# Patient Record
Sex: Female | Born: 1965 | Race: White | Hispanic: No | Marital: Married | Smoking: Never smoker
Health system: Southern US, Community
[De-identification: ages and names within clinical notes are randomized; demographics above are authoritative.]

## PROBLEM LIST (undated history)

## (undated) DIAGNOSIS — M1990 Unspecified osteoarthritis, unspecified site: Secondary | ICD-10-CM

## (undated) DIAGNOSIS — E039 Hypothyroidism, unspecified: Secondary | ICD-10-CM

---

## 1989-08-16 HISTORY — PX: THYROIDECTOMY, PARTIAL: SHX18

## 1995-08-17 HISTORY — PX: DILATION AND CURETTAGE OF UTERUS: SHX78

## 1996-08-16 HISTORY — PX: SHOULDER ARTHROSCOPY: SHX128

## 1998-09-01 ENCOUNTER — Ambulatory Visit (HOSPITAL_BASED_OUTPATIENT_CLINIC_OR_DEPARTMENT_OTHER): Admission: RE | Admit: 1998-09-01 | Discharge: 1998-09-01 | Payer: Self-pay | Admitting: Orthopedic Surgery

## 1998-12-31 ENCOUNTER — Other Ambulatory Visit: Admission: RE | Admit: 1998-12-31 | Discharge: 1998-12-31 | Payer: Self-pay | Admitting: Obstetrics and Gynecology

## 2000-02-04 ENCOUNTER — Other Ambulatory Visit: Admission: RE | Admit: 2000-02-04 | Discharge: 2000-02-04 | Payer: Self-pay | Admitting: Obstetrics and Gynecology

## 2001-05-31 ENCOUNTER — Other Ambulatory Visit: Admission: RE | Admit: 2001-05-31 | Discharge: 2001-05-31 | Payer: Self-pay | Admitting: Obstetrics and Gynecology

## 2002-01-26 ENCOUNTER — Other Ambulatory Visit: Admission: RE | Admit: 2002-01-26 | Discharge: 2002-01-26 | Payer: Self-pay | Admitting: Family Medicine

## 2004-05-11 ENCOUNTER — Other Ambulatory Visit: Admission: RE | Admit: 2004-05-11 | Discharge: 2004-05-11 | Payer: Self-pay | Admitting: Obstetrics and Gynecology

## 2005-09-27 ENCOUNTER — Emergency Department (HOSPITAL_COMMUNITY): Admission: EM | Admit: 2005-09-27 | Discharge: 2005-09-27 | Payer: Self-pay | Admitting: Emergency Medicine

## 2009-11-14 ENCOUNTER — Encounter: Admission: RE | Admit: 2009-11-14 | Discharge: 2009-11-14 | Payer: Self-pay | Admitting: Family Medicine

## 2009-12-09 ENCOUNTER — Other Ambulatory Visit: Admission: RE | Admit: 2009-12-09 | Discharge: 2009-12-09 | Payer: Self-pay | Admitting: Family Medicine

## 2009-12-30 ENCOUNTER — Encounter: Admission: RE | Admit: 2009-12-30 | Discharge: 2009-12-30 | Payer: Self-pay | Admitting: Internal Medicine

## 2009-12-30 ENCOUNTER — Other Ambulatory Visit: Admission: RE | Admit: 2009-12-30 | Discharge: 2009-12-30 | Payer: Self-pay | Admitting: Interventional Radiology

## 2011-03-15 ENCOUNTER — Encounter: Payer: Self-pay | Admitting: Podiatry

## 2012-12-14 ENCOUNTER — Encounter (HOSPITAL_BASED_OUTPATIENT_CLINIC_OR_DEPARTMENT_OTHER): Payer: Self-pay | Admitting: *Deleted

## 2012-12-14 NOTE — H&P (Signed)
Ashley Dudley is an 47 y.o. female.   Chief Complaint: Right knee pain HPI: Patient returns today reporting that the cortisone injection got rid of the majority of her right knee  pain, however, she still cannot straighten out her knee and has about a 10 flexion contracture now going on for 2 months.  Her medial right knee pain began when she was sledding on 09/29/2012.  Her foot got caught in the snow, twisting her knee back with a palpable pop.  Since then the pain has gotten better but she still cannot fully straighten her leg past 15 without significant discomfort.    Past Medical History  Diagnosis Date  . Hypothyroidism   . Arthritis     Past Surgical History  Procedure Laterality Date  . Shoulder arthroscopy  1998    right  . Thyroidectomy, partial  1991    left  . Dilation and curettage of uterus      History reviewed. No pertinent family history. Social History:  reports that she has never smoked. She does not have any smokeless tobacco history on file. She reports that  drinks alcohol. She reports that she does not use illicit drugs.  Allergies: No Known Allergies  No prescriptions prior to admission    No results found for this or any previous visit (from the past 48 hour(s)). No results found.  Review of Systems  Constitutional: Negative.   HENT: Negative.   Eyes: Negative.   Respiratory: Negative.   Cardiovascular: Negative.   Gastrointestinal: Negative.   Genitourinary: Negative.   Musculoskeletal: Positive for joint pain.  Skin: Negative.   Neurological: Negative.   Endo/Heme/Allergies: Negative.   Psychiatric/Behavioral: Negative.     Height 5\' 5"  (1.651 m), weight 68.04 kg (150 lb), last menstrual period 09/29/2012. Physical Exam  Constitutional: She is oriented to person, place, and time. She appears well-developed and well-nourished.  HENT:  Head: Normocephalic.  Eyes: Pupils are equal, round, and reactive to light.  Cardiovascular: Intact distal  pulses.   Respiratory: Effort normal.  Musculoskeletal:       Right knee: She exhibits decreased range of motion. Tenderness found. Medial joint line tenderness noted.  Neurological: She is alert and oriented to person, place, and time.  Psychiatric: She has a normal mood and affect.     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  10 flexion contracture secondary to probable incarcerated meniscus.  Plan: Options were discussed at length with the patient.  We'll get her set up for arthroscopic evaluation treatment of her right knee wrist benefits of surgery been discussed at length.  I will see her back at the outpatient surgery center.  Ashley Dudley M. 12/14/2012, 2:17 PM

## 2012-12-14 NOTE — Progress Notes (Signed)
No heart or resp problems 

## 2012-12-15 ENCOUNTER — Other Ambulatory Visit: Payer: Self-pay | Admitting: Orthopedic Surgery

## 2012-12-18 ENCOUNTER — Ambulatory Visit (HOSPITAL_BASED_OUTPATIENT_CLINIC_OR_DEPARTMENT_OTHER)
Admission: RE | Admit: 2012-12-18 | Discharge: 2012-12-18 | Disposition: A | Discharge status: Home / Self Care | Payer: BC Managed Care – PPO | Source: Ambulatory Visit | Attending: Orthopedic Surgery | Admitting: Orthopedic Surgery

## 2012-12-18 ENCOUNTER — Encounter (HOSPITAL_BASED_OUTPATIENT_CLINIC_OR_DEPARTMENT_OTHER)
Admission: RE | Disposition: A | Discharge status: Home / Self Care | Payer: Self-pay | Source: Ambulatory Visit | Attending: Orthopedic Surgery

## 2012-12-18 ENCOUNTER — Encounter (HOSPITAL_BASED_OUTPATIENT_CLINIC_OR_DEPARTMENT_OTHER): Payer: Self-pay | Admitting: Anesthesiology

## 2012-12-18 ENCOUNTER — Ambulatory Visit (HOSPITAL_BASED_OUTPATIENT_CLINIC_OR_DEPARTMENT_OTHER): Payer: BC Managed Care – PPO | Admitting: Anesthesiology

## 2012-12-18 DIAGNOSIS — S83509A Sprain of unspecified cruciate ligament of unspecified knee, initial encounter: Secondary | ICD-10-CM | POA: Insufficient documentation

## 2012-12-18 DIAGNOSIS — M129 Arthropathy, unspecified: Secondary | ICD-10-CM | POA: Insufficient documentation

## 2012-12-18 DIAGNOSIS — IMO0002 Reserved for concepts with insufficient information to code with codable children: Secondary | ICD-10-CM | POA: Insufficient documentation

## 2012-12-18 DIAGNOSIS — M25561 Pain in right knee: Secondary | ICD-10-CM

## 2012-12-18 DIAGNOSIS — X500XXA Overexertion from strenuous movement or load, initial encounter: Secondary | ICD-10-CM | POA: Insufficient documentation

## 2012-12-18 DIAGNOSIS — E039 Hypothyroidism, unspecified: Secondary | ICD-10-CM | POA: Insufficient documentation

## 2012-12-18 HISTORY — PX: KNEE ARTHROSCOPY WITH MEDIAL MENISECTOMY: SHX5651

## 2012-12-18 HISTORY — DX: Hypothyroidism, unspecified: E03.9

## 2012-12-18 HISTORY — DX: Unspecified osteoarthritis, unspecified site: M19.90

## 2012-12-18 SURGERY — ARTHROSCOPY, KNEE, WITH MEDIAL MENISCECTOMY
Anesthesia: General | Site: Knee | Laterality: Right | Wound class: Clean

## 2012-12-18 MED ORDER — ACETAMINOPHEN 10 MG/ML IV SOLN
1000.0000 mg | Freq: Once | INTRAVENOUS | Status: AC
  Administered 2012-12-18: 1000 mg via INTRAVENOUS

## 2012-12-18 MED ORDER — PROPOFOL 10 MG/ML IV BOLUS
INTRAVENOUS | Status: DC | PRN
  Administered 2012-12-18: 200 mg via INTRAVENOUS

## 2012-12-18 MED ORDER — MEPERIDINE HCL 25 MG/ML IJ SOLN: 6.2500 mg | INTRAMUSCULAR | Status: DC | PRN

## 2012-12-18 MED ORDER — FENTANYL CITRATE 0.05 MG/ML IJ SOLN
25.0000 ug | INTRAMUSCULAR | Status: DC | PRN
  Administered 2012-12-18: 50 ug via INTRAVENOUS
  Administered 2012-12-18: 25 ug via INTRAVENOUS

## 2012-12-18 MED ORDER — MIDAZOLAM HCL 2 MG/2ML IJ SOLN: 0.5000 mg | Freq: Once | INTRAMUSCULAR | Status: DC | PRN

## 2012-12-18 MED ORDER — SODIUM CHLORIDE 0.9 % IR SOLN
Status: DC | PRN
  Administered 2012-12-18: 12:00:00

## 2012-12-18 MED ORDER — CEFAZOLIN SODIUM-DEXTROSE 2-3 GM-% IV SOLR
2.0000 g | INTRAVENOUS | Status: AC
  Administered 2012-12-18: 2 g via INTRAVENOUS

## 2012-12-18 MED ORDER — FENTANYL CITRATE 0.05 MG/ML IJ SOLN
INTRAMUSCULAR | Status: DC | PRN
  Administered 2012-12-18: 50 ug via INTRAVENOUS
  Administered 2012-12-18: 100 ug via INTRAVENOUS

## 2012-12-18 MED ORDER — DEXAMETHASONE SODIUM PHOSPHATE 4 MG/ML IJ SOLN
INTRAMUSCULAR | Status: DC | PRN
  Administered 2012-12-18: 10 mg via INTRAVENOUS

## 2012-12-18 MED ORDER — ONDANSETRON HCL 4 MG/2ML IJ SOLN
INTRAMUSCULAR | Status: DC | PRN
  Administered 2012-12-18: 4 mg via INTRAVENOUS

## 2012-12-18 MED ORDER — OXYCODONE HCL 5 MG PO TABS: 5.0000 mg | ORAL_TABLET | Freq: Once | ORAL | Status: DC | PRN

## 2012-12-18 MED ORDER — HYDROCODONE-ACETAMINOPHEN 5-325 MG PO TABS: 1.0000 | ORAL_TABLET | ORAL | Status: DC | PRN

## 2012-12-18 MED ORDER — BUPIVACAINE-EPINEPHRINE 0.5% -1:200000 IJ SOLN
INTRAMUSCULAR | Status: DC | PRN
  Administered 2012-12-18: 20 mL

## 2012-12-18 MED ORDER — FENTANYL CITRATE 0.05 MG/ML IJ SOLN: 50.0000 ug | INTRAMUSCULAR | Status: DC | PRN

## 2012-12-18 MED ORDER — OXYCODONE HCL 5 MG/5ML PO SOLN: 5.0000 mg | Freq: Once | ORAL | Status: DC | PRN

## 2012-12-18 MED ORDER — LACTATED RINGERS IV SOLN
INTRAVENOUS | Status: DC
Start: 2012-12-18 — End: 2012-12-18
  Administered 2012-12-18 (×2): via INTRAVENOUS

## 2012-12-18 MED ORDER — CHLORHEXIDINE GLUCONATE 4 % EX LIQD: 60.0000 mL | Freq: Once | CUTANEOUS | Status: DC

## 2012-12-18 MED ORDER — PROMETHAZINE HCL 25 MG/ML IJ SOLN: 6.2500 mg | INTRAMUSCULAR | Status: DC | PRN

## 2012-12-18 MED ORDER — DEXTROSE-NACL 5-0.45 % IV SOLN: INTRAVENOUS | Status: DC

## 2012-12-18 MED ORDER — MIDAZOLAM HCL 5 MG/5ML IJ SOLN
INTRAMUSCULAR | Status: DC | PRN
  Administered 2012-12-18: 2 mg via INTRAVENOUS

## 2012-12-18 MED ORDER — MIDAZOLAM HCL 2 MG/2ML IJ SOLN: 1.0000 mg | INTRAMUSCULAR | Status: DC | PRN

## 2012-12-18 MED ORDER — LIDOCAINE HCL (CARDIAC) 20 MG/ML IV SOLN
INTRAVENOUS | Status: DC | PRN
  Administered 2012-12-18: 20 mg via INTRAVENOUS

## 2012-12-18 SURGICAL SUPPLY — 40 items
BANDAGE ELASTIC 6 VELCRO ST LF (GAUZE/BANDAGES/DRESSINGS) ×2 IMPLANT
BLADE 4.2CUDA (BLADE) IMPLANT
BLADE CUTTER GATOR 3.5 (BLADE) ×1 IMPLANT
BLADE GREAT WHITE 4.2 (BLADE) IMPLANT
CANISTER OMNI JUG 16 LITER (MISCELLANEOUS) ×1 IMPLANT
CANISTER SUCTION 2500CC (MISCELLANEOUS) IMPLANT
CHLORAPREP W/TINT 26ML (MISCELLANEOUS) ×2 IMPLANT
CLOTH BEACON ORANGE TIMEOUT ST (SAFETY) ×2 IMPLANT
DRAPE ARTHROSCOPY W/POUCH 114 (DRAPES) ×2 IMPLANT
ELECT MENISCUS 165MM 90D (ELECTRODE) IMPLANT
ELECT REM PT RETURN 9FT ADLT (ELECTROSURGICAL)
ELECTRODE REM PT RTRN 9FT ADLT (ELECTROSURGICAL) IMPLANT
GAUZE XEROFORM 1X8 LF (GAUZE/BANDAGES/DRESSINGS) ×2 IMPLANT
GLOVE BIO SURGEON STRL SZ7 (GLOVE) ×2 IMPLANT
GLOVE BIO SURGEON STRL SZ7.5 (GLOVE) ×2 IMPLANT
GLOVE BIOGEL M 7.0 STRL (GLOVE) ×1 IMPLANT
GLOVE BIOGEL PI IND STRL 7.0 (GLOVE) ×1 IMPLANT
GLOVE BIOGEL PI IND STRL 7.5 (GLOVE) IMPLANT
GLOVE BIOGEL PI IND STRL 8 (GLOVE) ×1 IMPLANT
GLOVE BIOGEL PI INDICATOR 7.0 (GLOVE) ×1
GLOVE BIOGEL PI INDICATOR 7.5 (GLOVE) ×1
GLOVE BIOGEL PI INDICATOR 8 (GLOVE) ×1
GLOVE EXAM NITRILE MD LF STRL (GLOVE) ×1 IMPLANT
GOWN PREVENTION PLUS XLARGE (GOWN DISPOSABLE) ×4 IMPLANT
IV NS IRRIG 3000ML ARTHROMATIC (IV SOLUTION) ×1 IMPLANT
KNEE WRAP E Z 3 GEL PACK (MISCELLANEOUS) ×2 IMPLANT
NDL SAFETY ECLIPSE 18X1.5 (NEEDLE) ×1 IMPLANT
NEEDLE HYPO 18GX1.5 SHARP (NEEDLE) ×2
PACK ARTHROSCOPY DSU (CUSTOM PROCEDURE TRAY) ×2 IMPLANT
PACK BASIN DAY SURGERY FS (CUSTOM PROCEDURE TRAY) ×2 IMPLANT
PENCIL BUTTON HOLSTER BLD 10FT (ELECTRODE) IMPLANT
SET ARTHROSCOPY TUBING (MISCELLANEOUS) ×2
SET ARTHROSCOPY TUBING LN (MISCELLANEOUS) ×1 IMPLANT
SLEEVE SCD COMPRESS KNEE MED (MISCELLANEOUS) IMPLANT
SPONGE GAUZE 4X4 12PLY (GAUZE/BANDAGES/DRESSINGS) ×2 IMPLANT
SYR 3ML 18GX1 1/2 (SYRINGE) IMPLANT
SYR 5ML LL (SYRINGE) ×2 IMPLANT
TOWEL OR 17X24 6PK STRL BLUE (TOWEL DISPOSABLE) ×2 IMPLANT
WAND STAR VAC 90 (SURGICAL WAND) IMPLANT
WATER STERILE IRR 1000ML POUR (IV SOLUTION) ×2 IMPLANT

## 2012-12-18 NOTE — Anesthesia Postprocedure Evaluation (Signed)
  Anesthesia Post-op Note  Patient: Ashley Dudley  Procedure(s) Performed: Procedure(s): Right Knee Arthroscopy with Partial Medial Menisectomy (Posterior Horn) and Debridement Partial Anterior Cruciate Ligament Tear (Right)  Patient Location: PACU  Anesthesia Type:General  Level of Consciousness: awake, alert , oriented and patient cooperative  Airway and Oxygen Therapy: Patient Spontanous Breathing  Post-op Pain: none  Post-op Assessment: Post-op Vital signs reviewed, Patient's Cardiovascular Status Stable, Respiratory Function Stable, Patent Airway, No signs of Nausea or vomiting and Pain level controlled  Post-op Vital Signs: Reviewed and stable  Complications: No apparent anesthesia complications

## 2012-12-18 NOTE — Transfer of Care (Signed)
Immediate Anesthesia Transfer of Care Note  Patient: Ashley Dudley  Procedure(s) Performed: Procedure(s): Right Knee Arthroscopy with Partial Medial Menisectomy (Posterior Horn) and Debridement Partial Anterior Cruciate Ligament Tear (Right)  Patient Location: PACU  Anesthesia Type:General  Level of Consciousness: sedated  Airway & Oxygen Therapy: Patient Spontanous Breathing and Patient connected to face mask oxygen  Post-op Assessment: Report given to PACU RN and Post -op Vital signs reviewed and stable  Post vital signs: Reviewed and stable  Complications: No apparent anesthesia complications

## 2012-12-18 NOTE — Anesthesia Preprocedure Evaluation (Signed)
Anesthesia Evaluation  Patient identified by MRN, date of birth, ID band Patient awake    Reviewed: Allergy & Precautions, H&P , NPO status , Patient's Chart, lab work & pertinent test results  History of Anesthesia Complications Negative for: history of anesthetic complications  Airway Mallampati: I TM Distance: >3 FB Neck ROM: Full    Dental  (+) Dental Advisory Given, Teeth Intact and Missing   Pulmonary neg pulmonary ROS,  breath sounds clear to auscultation  Pulmonary exam normal       Cardiovascular negative cardio ROS  Rhythm:Regular Rate:Normal     Neuro/Psych negative neurological ROS  negative psych ROS   GI/Hepatic negative GI ROS, Neg liver ROS,   Endo/Other  Hypothyroidism   Renal/GU negative Renal ROS     Musculoskeletal   Abdominal   Peds  Hematology   Anesthesia Other Findings   Reproductive/Obstetrics                           Anesthesia Physical Anesthesia Plan  ASA: II  Anesthesia Plan: General   Post-op Pain Management:    Induction: Intravenous  Airway Management Planned: LMA  Additional Equipment:   Intra-op Plan:   Post-operative Plan:   Informed Consent: I have reviewed the patients History and Physical, chart, labs and discussed the procedure including the risks, benefits and alternatives for the proposed anesthesia with the patient or authorized representative who has indicated his/her understanding and acceptance.   Dental advisory given  Plan Discussed with: Surgeon and CRNA  Anesthesia Plan Comments: (Plan routine monitors, GA- LMA OK)        Anesthesia Quick Evaluation

## 2012-12-18 NOTE — Op Note (Signed)
Pre-Op Dx: Right knee medial meniscal tear  Postop Dx: Same with partial anterior cruciate ligament tear   Procedure: Right knee arthroscopic partial medial meniscectomy posterior horn, debridement of partial anterior cruciate ligament tear anterolateral  Surgeon: Feliberto Gottron. Turner Daniels M.D.  Assist: Shirl Harris PA-C  Anes: General LMA  EBL: Minimal  Fluids: 800 cc   Indications: Patient was injured sledding a few months ago and has had pretty much a locked knee ever since then. Tender along the posterior and posterior medial joint lines consistent with medial meniscal tear. Lacks 10 of full extension and walks with a limp.. Pt has failed conservative treatment with anti-inflammatory medicines, observation, and modified activites but did get good temporarily from an intra-articular cortisone injection regarding pain, but still had a 10 flexion contracture.. Pain has recurred and patient desires elective arthroscopic evaluation and treatment of knee. Risks and benefits of surgery have been discussed and questions answered.  Procedure: Patient identified by arm band and taken to the operating room at the day surgery Center. The appropriate anesthetic monitors were attached, and General LMA anesthesia was induced without difficulty. Lateral post was applied to the table and the lower extremity was prepped and draped in usual sterile fashion from the ankle to the midthigh. Time out procedure was performed. We began the operation by making standard inferior lateral and inferior medial peripatellar portals with a #11 blade allowing introduction of the arthroscope through the inferior lateral portal and the out flow to the inferior medial portal. Pump pressure was set at 100 mmHg and diagnostic arthroscopy  revealed a normal patellofemoral joint, normal articular cartilage the medial and lateral compartments. The posterior horn of the medial meniscus had appeared the care that was flipping in and out of the  joint and this was removed with a 3.5 mm Gator sucker shaver. The anterolateral portion of the anterior cruciate ligament was frayed and torn and this was debrided back to stable margin, 34 so the fibers remained and were intact. The gutters were cleared medially and laterally the rest of the menisci were thoroughly probed and found to be intact.. The knee was irrigated out normal saline solution. A dressing of xerofoam 4 x 4 dressing sponges, web roll and an Ace wrap was applied. The patient was awakened extubated and taken to the recovery without difficulty.    Signed: Nestor Lewandowsky, MD

## 2012-12-18 NOTE — Interval H&P Note (Signed)
History and Physical Interval Note:  12/18/2012 11:44 AM  Ashley Dudley  has presented today for surgery, with the diagnosis of Right Knee Medial Meniscal Tear  The various methods of treatment have been discussed with the patient and family. After consideration of risks, benefits and other options for treatment, the patient has consented to  Procedure(s): RIGHT KNEE ARTHROSCOPY  (Right) as a surgical intervention .  The patient's history has been reviewed, patient examined, no change in status, stable for surgery.  I have reviewed the patient's chart and labs.  Questions were answered to the patient's satisfaction.     Nestor Lewandowsky

## 2012-12-18 NOTE — Anesthesia Procedure Notes (Signed)
Procedure Name: LMA Insertion Date/Time: 12/18/2012 12:05 PM Performed by: Burna Cash Pre-anesthesia Checklist: Patient identified, Emergency Drugs available, Suction available and Patient being monitored Patient Re-evaluated:Patient Re-evaluated prior to inductionOxygen Delivery Method: Circle System Utilized Preoxygenation: Pre-oxygenation with 100% oxygen Intubation Type: IV induction Ventilation: Mask ventilation without difficulty LMA: LMA inserted LMA Size: 4.0 Number of attempts: 1 Airway Equipment and Method: bite block Placement Confirmation: positive ETCO2 Tube secured with: Tape Dental Injury: Teeth and Oropharynx as per pre-operative assessment

## 2012-12-19 ENCOUNTER — Encounter (HOSPITAL_BASED_OUTPATIENT_CLINIC_OR_DEPARTMENT_OTHER): Payer: Self-pay | Admitting: Orthopedic Surgery

## 2013-04-26 ENCOUNTER — Other Ambulatory Visit: Payer: Self-pay | Admitting: Family Medicine

## 2013-04-26 ENCOUNTER — Other Ambulatory Visit (HOSPITAL_COMMUNITY)
Admission: RE | Admit: 2013-04-26 | Discharge: 2013-04-26 | Disposition: A | Discharge status: Home / Self Care | Payer: BC Managed Care – PPO | Source: Ambulatory Visit | Attending: Family Medicine | Admitting: Family Medicine

## 2013-04-26 DIAGNOSIS — E049 Nontoxic goiter, unspecified: Secondary | ICD-10-CM

## 2013-04-26 DIAGNOSIS — Z01419 Encounter for gynecological examination (general) (routine) without abnormal findings: Secondary | ICD-10-CM | POA: Insufficient documentation

## 2013-05-08 ENCOUNTER — Ambulatory Visit
Admission: RE | Admit: 2013-05-08 | Discharge: 2013-05-08 | Disposition: A | Discharge status: Home / Self Care | Payer: BC Managed Care – PPO | Source: Ambulatory Visit | Attending: Family Medicine | Admitting: Family Medicine

## 2013-05-08 DIAGNOSIS — E049 Nontoxic goiter, unspecified: Secondary | ICD-10-CM

## 2013-05-21 ENCOUNTER — Encounter (INDEPENDENT_AMBULATORY_CARE_PROVIDER_SITE_OTHER): Payer: Self-pay | Admitting: Surgery

## 2013-05-21 ENCOUNTER — Ambulatory Visit (INDEPENDENT_AMBULATORY_CARE_PROVIDER_SITE_OTHER): Payer: BC Managed Care – PPO | Admitting: Surgery

## 2013-05-21 VITALS — BP 110/68 | HR 64 | Temp 97.4°F | Resp 14 | Ht 65.0 in | Wt 157.2 lb

## 2013-05-21 DIAGNOSIS — E041 Nontoxic single thyroid nodule: Secondary | ICD-10-CM

## 2013-05-21 NOTE — Patient Instructions (Signed)
Thyroid Biopsy The thyroid gland is a butterfly-shaped gland situated in the front of the neck. It produces hormones which affect metabolism, growth and development, and body temperature. A thyroid biopsy is a procedure in which small samples of tissue or fluid are removed from the thyroid gland or mass and examined under a microscope. This test is done to determine the cause of thyroid problems, such as infection, cancer, or other thyroid problems. There are 2 ways to obtain samples: 1. Fine needle biopsy. Samples are removed using a thin needle inserted through the skin and into the thyroid gland or mass. 2. Open biopsy. Samples are removed after a cut (incision) is made through the skin. LET YOUR CAREGIVER KNOW ABOUT:   Allergies.  Medications taken including herbs, eye drops, over-the-counter medications, and creams.  Use of steroids (by mouth or creams).  Previous problems with anesthetics or numbing medicine.  Possibility of pregnancy, if this applies.  History of blood clots (thrombophlebitis).  History of bleeding or blood problems.  Previous surgery.  Other health problems. RISKS AND COMPLICATIONS  Bleeding from the site. The risk of bleeding is higher if you have a bleeding disorder or are taking any blood thinning medications (anticoagulants).  Infection.  Injury to structures near the thyroid gland. BEFORE THE PROCEDURE  This is a procedure that can be done as an outpatient. Confirm the time that you need to arrive for your procedure. Confirm whether there is a need to fast or withhold any medications. A blood sample may be done to determine your blood clotting time. Medicine may be given to help you relax (sedative). PROCEDURE Fine needle biopsy. You will be awake during the procedure. You may be asked to lie on your back with your head tipped backward to extend your neck. Let your caregiver know if you cannot tolerate the positioning. An area on your neck will be  cleansed. A needle is inserted through the skin of your neck. You may feel a mild discomfort during this procedure. You may be asked to avoid coughing, talking, swallowing, or making sounds during some portions of the procedure. The needle is withdrawn once tissue or fluid samples have been removed. Pressure may be applied to the neck to reduce swelling and ensure that bleeding has stopped. The samples will be sent for examination.  Open biopsy. You will be given general anesthesia. You will be asleep during the procedure. An incision is made in your neck. A sample of thyroid tissue or the mass is removed. The tissue sample or mass will be sent for examination. The sample or mass may be examined during the biopsy. If the sample or mass contains cancer cells, some or all of the thyroid gland may be removed. The incision is closed with stitches. AFTER THE PROCEDURE  Your recovery will be assessed and monitored. If there are no problems, as an outpatient, you should be able to go home shortly after the procedure. If you had a fine needle biopsy:  You may have soreness at the biopsy site for 1 to 2 days. If you had an open biopsy:   You may have soreness at the biopsy site for 3 to 4 days.  You may have a hoarse voice or sore throat for 1 to 2 days. Obtaining the Test Results It is your responsibility to obtain your test results. Do not assume everything is normal if you have not heard from your caregiver or the medical facility. It is important for you to follow up  on all of your test results. HOME CARE INSTRUCTIONS   Keeping your head raised on a pillow when you are lying down may ease biopsy site discomfort.  Supporting the back of your head and neck with both hands as you sit up from a lying position may ease biopsy site discomfort.  Only take over-the-counter or prescription medicines for pain, discomfort, or fever as directed by your caregiver.  Throat lozenges or gargling with warm salt  water may help to soothe a sore throat. SEEK IMMEDIATE MEDICAL CARE IF:   You have severe bleeding from the biopsy site.  You have difficulty swallowing.  You have a fever.  You have increased pain, swelling, redness, or warmth at the biopsy site.  You notice pus coming from the biopsy site.  You have swollen glands (lymph nodes) in your neck. Document Released: 05/30/2007 Document Revised: 10/25/2011 Document Reviewed: 10/30/2008 [facility] Patient Information 2014 [place], [place].

## 2013-05-21 NOTE — Progress Notes (Signed)
Patient ID: Ashley Dudley, female   DOB: Aug 29, 1965, 47 y.o.   MRN: [id number]  Chief Complaint  Patient presents with  . New Evaluation    eval thyroid cyst    HPI ARALYNN BRAKE is a 47 y.o. female.  Patient sent today at the request of Dr. Faylene Million for right thyroid nodule. Patient has history of left lower lobectomy 20 years ago for benign disease. She is developed some swelling in the right thyroid lobe. Underwent biopsy of the right thyroid gland 2011 which showed changes consistent with goiter. There is larger. She has minimal discomfort. Her husband has noted some difficulty swallowing. She otherwise states this is asymptomatic. She is on thyroid replacement for suppressive means. No hoarseness or voice changes. HPI  Past Medical History  Diagnosis Date  . Hypothyroidism   . Arthritis     Past Surgical History  Procedure Laterality Date  . Shoulder arthroscopy  1998    right  . Thyroidectomy, partial  1991    left  . Dilation and curettage of uterus    . Knee arthroscopy with medial menisectomy Right 12/18/2012    Procedure: Right Knee Arthroscopy with Partial Medial Menisectomy (Posterior Margaretmary Eddy) and Debridement Partial Anterior Cruciate Ligament Tear;  Surgeon: Nestor Lewandowsky, MD;  Location: [facility];  Service: Orthopedics;  Laterality: Right;    Family History  Problem Relation Age of Onset  . Cancer Brother     hodgekins lymphoma  . Cancer Maternal Grandmother     ovarian    Social History History  Substance Use Topics  . Smoking status: Never Smoker   . Smokeless tobacco: Never Used  . Alcohol Use: Yes     Comment: very rare    No Known Allergies  Current Outpatient Prescriptions  Medication Sig Dispense Refill  . Alfalfa 500 MG TABS Take by mouth.      . calcium acetate (PHOSLO) 667 MG capsule Take 667 mg by mouth 3 (three) times daily with meals.      Marland Kitchen glucosamine-chondroitin 500-400 MG tablet Take 1 tablet by mouth 3 (three) times daily.       . meloxicam (MOBIC) 15 MG tablet Take 15 mg by mouth daily.      . Multiple Vitamins-Minerals (MULTIVITAMIN WITH MINERALS) tablet Take 1 tablet by mouth daily.      Marland Kitchen thyroid (ARMOUR) 15 MG tablet Take 15 mg by mouth daily.      . vitamin C (ASCORBIC ACID) 250 MG tablet Take 250 mg by mouth daily.       No current facility-administered medications for this visit.    Review of Systems Review of Systems  Constitutional: Negative for fever, chills and unexpected weight change.  HENT: Negative for hearing loss, congestion, sore throat, trouble swallowing and voice change.   Eyes: Negative for visual disturbance.  Respiratory: Negative for cough and wheezing.   Cardiovascular: Negative for chest pain, palpitations and leg swelling.  Gastrointestinal: Negative for nausea, vomiting, abdominal pain, diarrhea, constipation, blood in stool, abdominal distention and anal bleeding.  Genitourinary: Negative for hematuria, vaginal bleeding and difficulty urinating.  Musculoskeletal: Negative for arthralgias.  Skin: Negative for rash and wound.  Neurological: Negative for seizures, syncope and headaches.  Hematological: Negative for adenopathy. Does not bruise/bleed easily.  Psychiatric/Behavioral: Negative for confusion.    Blood pressure 110/68, pulse 64, temperature 97.4 F (36.3 C), temperature source Temporal, resp. rate 14, height 5\' 5"  (1.651 m), weight 157 lb 3.2 oz (71.305 kg).  Physical Exam Physical Exam  Constitutional: She is oriented to person, place, and time. She appears well-developed and well-nourished.  HENT:  Head: Normocephalic and atraumatic.  Eyes: EOM are normal. Pupils are equal, round, and reactive to light.  Neck: No tracheal deviation present. Thyromegaly present.  Cardiovascular: Normal rate and regular rhythm.   Pulmonary/Chest: Effort normal and breath sounds normal. No stridor.  Musculoskeletal: Normal range of motion.  Lymphadenopathy:    She has no cervical  adenopathy.  Neurological: She is alert and oriented to person, place, and time.  Skin: Skin is warm and dry.  Psychiatric: She has a normal mood and affect. Her behavior is normal. Judgment and thought content normal.    Data Reviewed CLINICAL DATA: Goiter. Previous FNA biopsy of a dominant right mid  lesion and isthmic lesion 12/30/2009.  EXAM:  THYROID ULTRASOUND  TECHNIQUE:  Ultrasound examination of the thyroid gland and adjacent soft  tissues was performed.  COMPARISON: 12/30/2009 and earlier studies  FINDINGS:  Right thyroid lobe  Measurements: 51 x 20 x 33 mm. 19 x 28 x 33 mm complex cystic, mid  right.  9 mm solid, inferior right  5 mm solid, superior right  Left thyroid lobe  Measurements: Surgically absent. No significant residual/recurrent  tissue. No nodules visualized.  Isthmus  Thickness: 1.9 mm in thickness. 5 x 9 x 12 mm complex mostly solid,  right isthmus.  Lymphadenopathy  None visualized.  IMPRESSION:  1. Dominant 3.3 cm complex right cystic lesion. Correlate with  previous biopsy results.  2. Smaller isthmic and right lobe lesions as above.  3. Surgical removal of the left thyroid lobe with no  residual/recurrent tissue evident.  Electronically Signed  By: Oley Balm M.D.  On: 05/08/2013 12:16   Assessment    3 cm right thyroid complex cystic nodule    Plan    Recommend fine-needle aspiration. Further treatment after this is done. Discuss completion thyroidectomy the patient has been today. As another option. If benign, return in 6 months for followup. If suspicious will recommend completion thyroidectomy.       Arlander Gillen A. 05/21/2013, 11:51 AM

## 2013-05-22 ENCOUNTER — Other Ambulatory Visit (HOSPITAL_COMMUNITY)
Admission: RE | Admit: 2013-05-22 | Discharge: 2013-05-22 | Disposition: A | Discharge status: Home / Self Care | Payer: BC Managed Care – PPO | Source: Ambulatory Visit | Attending: Interventional Radiology | Admitting: Interventional Radiology

## 2013-05-22 ENCOUNTER — Ambulatory Visit
Admission: RE | Admit: 2013-05-22 | Discharge: 2013-05-22 | Disposition: A | Discharge status: Home / Self Care | Payer: BC Managed Care – PPO | Source: Ambulatory Visit | Attending: Surgery | Admitting: Surgery

## 2013-05-22 DIAGNOSIS — E041 Nontoxic single thyroid nodule: Secondary | ICD-10-CM | POA: Insufficient documentation

## 2013-05-25 ENCOUNTER — Telehealth (INDEPENDENT_AMBULATORY_CARE_PROVIDER_SITE_OTHER): Payer: Self-pay

## 2013-05-25 DIAGNOSIS — E041 Nontoxic single thyroid nodule: Secondary | ICD-10-CM

## 2013-05-25 NOTE — Telephone Encounter (Signed)
Message copied by Brennan Bailey on Fri May 25, 2013  4:35 PM ------      Message from: Harriette Bouillon A      Created: Thu May 24, 2013  7:05 AM       Needs to be repeated.  Not enough tissue to make diagnosis. Please reschedule with radiology ------

## 2013-05-25 NOTE — Telephone Encounter (Signed)
Called pt and gave her below msg. New order for thyroid biopsy put in epic.

## 2013-06-01 ENCOUNTER — Encounter (INDEPENDENT_AMBULATORY_CARE_PROVIDER_SITE_OTHER): Payer: Self-pay | Admitting: Surgery

## 2013-06-01 ENCOUNTER — Ambulatory Visit (INDEPENDENT_AMBULATORY_CARE_PROVIDER_SITE_OTHER): Payer: BC Managed Care – PPO | Admitting: Surgery

## 2013-06-01 ENCOUNTER — Telehealth (INDEPENDENT_AMBULATORY_CARE_PROVIDER_SITE_OTHER): Payer: Self-pay | Admitting: *Deleted

## 2013-06-01 VITALS — BP 120/80 | HR 68 | Temp 99.0°F | Resp 14 | Ht 65.0 in | Wt 158.0 lb

## 2013-06-01 DIAGNOSIS — E041 Nontoxic single thyroid nodule: Secondary | ICD-10-CM

## 2013-06-01 DIAGNOSIS — Z8639 Personal history of other endocrine, nutritional and metabolic disease: Secondary | ICD-10-CM

## 2013-06-01 DIAGNOSIS — Z862 Personal history of diseases of the blood and blood-forming organs and certain disorders involving the immune mechanism: Secondary | ICD-10-CM

## 2013-06-01 NOTE — Progress Notes (Signed)
Subjective:     Patient ID: Ashley Dudley, female   DOB: [birth year], 47 y.o.   MRN: [id number]  HPI  Patient returns after fine needle aspiration of right thyroid nodule. This was nondiagnostic. She is here to discuss next step. Review of Systems  Constitutional: Negative.   HENT: Negative.   Respiratory: Negative.   Cardiovascular: Negative.   Endocrine: Negative.        Objective:   Physical Exam  Constitutional: She appears well-developed and well-nourished.  HENT:  Head: Normocephalic and atraumatic.  Psychiatric: She has a normal mood and affect. Her behavior is normal. Judgment and thought content normal.       Assessment:     right thyroid nodule of undetermined significance status post nondiagnostic fine needle aspiration with history of hypothyroidism and previous left thyroid lobectomy for benign disease    Plan:     Discussed repeating her fine needle aspiration versus continued observation since the nodules been biopsied in the past versus completion thyroidectomy. Also scenarios  discussed with the patient and husband today.the risks and benefits of each discussed. She is leaning toward completion thyroidectomy. Will refer to ENT for laryngoscopy prior to any surgical intervention to evaluate cord function. She will call back to schedule or to follow depending on what they decide. Risks, benefits and alternatives of thyroidectomy discussed with the patient and husband today. Risk of bleeding, infection, damage to nerves in the neck causing voice and breathing problems, low calcium, injury to neighboring structures of the trachea, esophagus and major blood vessels discussed.

## 2013-06-01 NOTE — Patient Instructions (Signed)
Call to set up surgery or follow up.

## 2013-06-01 NOTE — Telephone Encounter (Signed)
LMOM for pt to return my call.  I was calling to inform her of an appt with Dr. Pollyann Kennedy (ENT) on 06/05/13 @ 1:10.  She needs to arrive at 12:55 and be sure to bring insurance cards, ID, as well as copay.  [facility] is located at [address].  Their phone number is [phone].

## 2013-06-21 ENCOUNTER — Other Ambulatory Visit: Payer: Self-pay

## 2013-06-21 ENCOUNTER — Telehealth (INDEPENDENT_AMBULATORY_CARE_PROVIDER_SITE_OTHER): Payer: Self-pay | Admitting: Surgery

## 2013-06-21 NOTE — Telephone Encounter (Addendum)
Called ENT office requesting their office notes for Dr Luisa Hart to review.

## 2013-06-21 NOTE — Telephone Encounter (Signed)
PT states seen ENT when is her surgery?

## 2013-06-22 ENCOUNTER — Telehealth (INDEPENDENT_AMBULATORY_CARE_PROVIDER_SITE_OTHER): Payer: Self-pay | Admitting: Surgery

## 2013-06-22 NOTE — Telephone Encounter (Signed)
Pt states: Why is my surgery not scheduled yet?  i called the ENT they stated they released me.  What do i have to do to get my surgery scheduled?  Explained to pt orders must be wrote  We cannot schedule surgery with out orders

## 2013-06-22 NOTE — Telephone Encounter (Signed)
Spoke to patient again and let her know I received the records late yesterday and I will have Dr Luisa Hart review them when he is back next week. She was wanting to make sure she could still get in for surgery before the end of the year. I let her know he still has openings on his calender for her to get scheduled by then.

## 2013-06-25 ENCOUNTER — Other Ambulatory Visit (INDEPENDENT_AMBULATORY_CARE_PROVIDER_SITE_OTHER): Payer: Self-pay | Admitting: Surgery

## 2013-07-02 ENCOUNTER — Encounter (HOSPITAL_COMMUNITY): Payer: Self-pay | Admitting: Pharmacy Technician

## 2013-07-05 ENCOUNTER — Encounter (HOSPITAL_COMMUNITY): Payer: Self-pay

## 2013-07-05 ENCOUNTER — Ambulatory Visit (HOSPITAL_COMMUNITY)
Admission: RE | Admit: 2013-07-05 | Discharge: 2013-07-05 | Disposition: A | Discharge status: Home / Self Care | Payer: BC Managed Care – PPO | Source: Ambulatory Visit | Attending: Surgery | Admitting: Surgery

## 2013-07-05 ENCOUNTER — Encounter (HOSPITAL_COMMUNITY)
Admission: RE | Admit: 2013-07-05 | Discharge: 2013-07-05 | Disposition: A | Discharge status: Home / Self Care | Payer: BC Managed Care – PPO | Source: Ambulatory Visit | Attending: Surgery | Admitting: Surgery

## 2013-07-05 DIAGNOSIS — Z01818 Encounter for other preprocedural examination: Secondary | ICD-10-CM | POA: Insufficient documentation

## 2013-07-05 LAB — BASIC METABOLIC PANEL
BUN: 12 mg/dL (ref 6–23)
CO2: 27 mEq/L (ref 19–32)
Calcium: 10 mg/dL (ref 8.4–10.5)
Chloride: 101 mEq/L (ref 96–112)
Creatinine, Ser: 0.76 mg/dL (ref 0.50–1.10)
GFR calc Af Amer: >90 mL/min (ref >90)
Glucose, Bld: 81 mg/dL (ref 70–99)

## 2013-07-05 LAB — CBC
HCT: 40.5 % (ref 36.0–46.0)
MCH: 29.6 pg (ref 26.0–34.0)
MCV: 90.2 fL (ref 78.0–100.0)
Platelets: 230 10*3/uL (ref 150–400)
RDW: 12.4 % (ref 11.5–15.5)
WBC: 6.6 10*3/uL (ref 4.0–10.5)

## 2013-07-05 NOTE — Pre-Procedure Instructions (Signed)
Ashley Dudley  07/05/2013   Your procedure is scheduled on:  07/17/13  Report to [facility]  2 * 3 at 730 AM.  Call this number if you have problems the morning of surgery: [phone]   Remember:   Do not eat food or drink liquids after midnight.   Take these medicines the morning of surgery with A SIP OF WATER: thyroid   Do not wear jewelry, make-up or nail polish.  Do not wear lotions, powders, or perfumes. You may wear deodorant.  Do not shave 48 hours prior to surgery. Men may shave face and neck.  Do not bring valuables to the hospital.  [facility] is not responsible                  for any belongings or valuables.               Contacts, dentures or bridgework may not be worn into surgery.  Leave suitcase in the car. After surgery it may be brought to your room.  For patients admitted to the hospital, discharge time is determined by your                treatment team.               Patients discharged the day of surgery will not be allowed to drive  home.  Name and phone number of your driver: family  Special Instructions: Shower using CHG 2 nights before surgery and the night before surgery.  If you shower the day of surgery use CHG.  Use special wash - you have one bottle of CHG for all showers.  You should use approximately 1/3 of the bottle for each shower.   Please read over the following fact sheets that you were given: Pain Booklet, Coughing and Deep Breathing and Surgical Site Infection Prevention

## 2013-07-16 MED ORDER — CEFAZOLIN SODIUM-DEXTROSE 2-3 GM-% IV SOLR
2.0000 g | INTRAVENOUS | Status: AC
  Administered 2013-07-17: 2 g via INTRAVENOUS
  Filled 2013-07-16: qty 50

## 2013-07-17 ENCOUNTER — Encounter (HOSPITAL_COMMUNITY): Payer: Self-pay | Admitting: *Deleted

## 2013-07-17 ENCOUNTER — Encounter (HOSPITAL_COMMUNITY): Payer: BC Managed Care – PPO | Admitting: Anesthesiology

## 2013-07-17 ENCOUNTER — Observation Stay (HOSPITAL_COMMUNITY)
Admission: RE | Admit: 2013-07-17 | Discharge: 2013-07-19 | Disposition: A | Discharge status: Home / Self Care | Payer: BC Managed Care – PPO | Source: Ambulatory Visit | Attending: Surgery | Admitting: Surgery

## 2013-07-17 ENCOUNTER — Ambulatory Visit (HOSPITAL_COMMUNITY): Payer: BC Managed Care – PPO | Admitting: Anesthesiology

## 2013-07-17 ENCOUNTER — Encounter (HOSPITAL_COMMUNITY)
Admission: RE | Disposition: A | Discharge status: Home / Self Care | Payer: Self-pay | Source: Ambulatory Visit | Attending: Surgery

## 2013-07-17 DIAGNOSIS — E039 Hypothyroidism, unspecified: Secondary | ICD-10-CM | POA: Insufficient documentation

## 2013-07-17 DIAGNOSIS — D34 Benign neoplasm of thyroid gland: Secondary | ICD-10-CM

## 2013-07-17 DIAGNOSIS — E041 Nontoxic single thyroid nodule: Principal | ICD-10-CM | POA: Diagnosis present

## 2013-07-17 DIAGNOSIS — E063 Autoimmune thyroiditis: Secondary | ICD-10-CM

## 2013-07-17 HISTORY — PX: THYROIDECTOMY: SHX17

## 2013-07-17 HISTORY — PX: TOTAL THYROIDECTOMY: SHX2547

## 2013-07-17 SURGERY — THYROIDECTOMY
Anesthesia: General | Site: Neck

## 2013-07-17 MED ORDER — OXYCODONE HCL 5 MG PO TABS: 5.0000 mg | ORAL_TABLET | Freq: Once | ORAL | Status: DC | PRN

## 2013-07-17 MED ORDER — LIDOCAINE HCL (CARDIAC) 20 MG/ML IV SOLN
INTRAVENOUS | Status: DC | PRN
  Administered 2013-07-17: 100 mg via INTRAVENOUS

## 2013-07-17 MED ORDER — 0.9 % SODIUM CHLORIDE (POUR BTL) OPTIME
TOPICAL | Status: DC | PRN
  Administered 2013-07-17: 1000 mL

## 2013-07-17 MED ORDER — ONDANSETRON HCL 4 MG PO TABS: 4.0000 mg | ORAL_TABLET | Freq: Four times a day (QID) | ORAL | Status: DC | PRN

## 2013-07-17 MED ORDER — ROCURONIUM BROMIDE 100 MG/10ML IV SOLN
INTRAVENOUS | Status: DC | PRN
  Administered 2013-07-17: 40 mg via INTRAVENOUS
  Administered 2013-07-17: 10 mg via INTRAVENOUS

## 2013-07-17 MED ORDER — PROPOFOL 10 MG/ML IV BOLUS
INTRAVENOUS | Status: DC | PRN
  Administered 2013-07-17: 120 mg via INTRAVENOUS
  Administered 2013-07-17: 30 mg via INTRAVENOUS
  Administered 2013-07-17: 20 mg via INTRAVENOUS

## 2013-07-17 MED ORDER — LACTATED RINGERS IV SOLN
INTRAVENOUS | Status: DC | PRN
  Administered 2013-07-17 (×2): via INTRAVENOUS

## 2013-07-17 MED ORDER — MORPHINE SULFATE 2 MG/ML IJ SOLN: 2.0000 mg | INTRAMUSCULAR | Status: DC | PRN

## 2013-07-17 MED ORDER — MIDAZOLAM HCL 5 MG/5ML IJ SOLN
INTRAMUSCULAR | Status: DC | PRN
  Administered 2013-07-17 (×2): 1 mg via INTRAVENOUS

## 2013-07-17 MED ORDER — HYDROMORPHONE HCL PF 1 MG/ML IJ SOLN
INTRAMUSCULAR | Status: AC
  Filled 2013-07-17: qty 1

## 2013-07-17 MED ORDER — DEXTROSE IN LACTATED RINGERS 5 % IV SOLN
INTRAVENOUS | Status: DC
  Administered 2013-07-17 – 2013-07-18 (×2): via INTRAVENOUS

## 2013-07-17 MED ORDER — THYROID 30 MG PO TABS
30.0000 mg | ORAL_TABLET | Freq: Every day | ORAL | Status: DC
  Administered 2013-07-18 – 2013-07-19 (×2): 30 mg via ORAL
  Filled 2013-07-17 (×4): qty 1

## 2013-07-17 MED ORDER — ONDANSETRON HCL 4 MG/2ML IJ SOLN: 4.0000 mg | Freq: Once | INTRAMUSCULAR | Status: DC | PRN

## 2013-07-17 MED ORDER — OXYCODONE-ACETAMINOPHEN 5-325 MG PO TABS
1.0000 | ORAL_TABLET | ORAL | Status: DC | PRN
Start: 2013-07-17 — End: 2013-07-19

## 2013-07-17 MED ORDER — GLYCOPYRROLATE 0.2 MG/ML IJ SOLN
INTRAMUSCULAR | Status: DC | PRN
  Administered 2013-07-17: .7 mg via INTRAVENOUS

## 2013-07-17 MED ORDER — CHLORHEXIDINE GLUCONATE 4 % EX LIQD: 1.0000 "application " | Freq: Once | CUTANEOUS | Status: DC

## 2013-07-17 MED ORDER — ARTIFICIAL TEARS OP OINT
TOPICAL_OINTMENT | OPHTHALMIC | Status: DC | PRN
  Administered 2013-07-17: 1 via OPHTHALMIC

## 2013-07-17 MED ORDER — OXYCODONE HCL 5 MG/5ML PO SOLN: 5.0000 mg | Freq: Once | ORAL | Status: DC | PRN

## 2013-07-17 MED ORDER — NEOSTIGMINE METHYLSULFATE 1 MG/ML IJ SOLN
INTRAMUSCULAR | Status: DC | PRN
  Administered 2013-07-17: 4 mg via INTRAVENOUS

## 2013-07-17 MED ORDER — LACTATED RINGERS IV SOLN
INTRAVENOUS | Status: DC
  Administered 2013-07-17: 08:00:00 via INTRAVENOUS

## 2013-07-17 MED ORDER — ENOXAPARIN SODIUM 40 MG/0.4ML ~~LOC~~ SOLN
40.0000 mg | SUBCUTANEOUS | Status: DC
  Administered 2013-07-18 – 2013-07-19 (×2): 40 mg via SUBCUTANEOUS
  Filled 2013-07-17 (×3): qty 0.4

## 2013-07-17 MED ORDER — HEMOSTATIC AGENTS (NO CHARGE) OPTIME
TOPICAL | Status: DC | PRN
  Administered 2013-07-17: 1 via TOPICAL

## 2013-07-17 MED ORDER — ONDANSETRON HCL 4 MG/2ML IJ SOLN
INTRAMUSCULAR | Status: DC | PRN
  Administered 2013-07-17: 4 mg via INTRAVENOUS

## 2013-07-17 MED ORDER — ONDANSETRON HCL 4 MG/2ML IJ SOLN
4.0000 mg | Freq: Four times a day (QID) | INTRAMUSCULAR | Status: DC | PRN
  Administered 2013-07-17 – 2013-07-18 (×3): 4 mg via INTRAVENOUS
  Filled 2013-07-17 (×3): qty 2

## 2013-07-17 MED ORDER — HYDROMORPHONE HCL PF 1 MG/ML IJ SOLN
0.2500 mg | INTRAMUSCULAR | Status: DC | PRN
  Administered 2013-07-17: 0.5 mg via INTRAVENOUS

## 2013-07-17 MED ORDER — FENTANYL CITRATE 0.05 MG/ML IJ SOLN
INTRAMUSCULAR | Status: DC | PRN
  Administered 2013-07-17 (×3): 50 ug via INTRAVENOUS
  Administered 2013-07-17: 100 ug via INTRAVENOUS

## 2013-07-17 MED ORDER — MEPERIDINE HCL 25 MG/ML IJ SOLN: 6.2500 mg | INTRAMUSCULAR | Status: DC | PRN

## 2013-07-17 MED ORDER — LABETALOL HCL 5 MG/ML IV SOLN
INTRAVENOUS | Status: DC | PRN
  Administered 2013-07-17 (×2): 5 mg via INTRAVENOUS

## 2013-07-17 SURGICAL SUPPLY — 47 items
ADH SKN CLS APL DERMABOND .7 (GAUZE/BANDAGES/DRESSINGS) ×1
BLADE SURG 10 STRL SS (BLADE) ×2 IMPLANT
BLADE SURG 15 STRL LF DISP TIS (BLADE) ×1 IMPLANT
BLADE SURG 15 STRL SS (BLADE) ×2
BLADE SURG ROTATE 9660 (MISCELLANEOUS) IMPLANT
CANISTER SUCTION 2500CC (MISCELLANEOUS) ×2 IMPLANT
CHLORAPREP W/TINT 10.5 ML (MISCELLANEOUS) ×2 IMPLANT
CLIP TI MEDIUM 24 (CLIP) ×2 IMPLANT
CLIP TI WIDE RED SMALL 24 (CLIP) ×2 IMPLANT
CONT SPEC 4OZ CLIKSEAL STRL BL (MISCELLANEOUS) IMPLANT
COVER SURGICAL LIGHT HANDLE (MISCELLANEOUS) ×2 IMPLANT
CRADLE DONUT ADULT HEAD (MISCELLANEOUS) ×2 IMPLANT
DERMABOND ADVANCED (GAUZE/BANDAGES/DRESSINGS) ×1
DERMABOND ADVANCED .7 DNX12 (GAUZE/BANDAGES/DRESSINGS) ×1 IMPLANT
DRAPE PED LAPAROTOMY (DRAPES) ×2 IMPLANT
DRAPE UTILITY 15X26 W/TAPE STR (DRAPE) ×4 IMPLANT
DRSG TEGADERM 2-3/8X2-3/4 SM (GAUZE/BANDAGES/DRESSINGS) ×1 IMPLANT
ELECT CAUTERY BLADE 6.4 (BLADE) ×2 IMPLANT
ELECT REM PT RETURN 9FT ADLT (ELECTROSURGICAL) ×2
ELECTRODE REM PT RTRN 9FT ADLT (ELECTROSURGICAL) ×1 IMPLANT
GAUZE SPONGE 4X4 16PLY XRAY LF (GAUZE/BANDAGES/DRESSINGS) ×2 IMPLANT
GLOVE BIO SURGEON STRL SZ8 (GLOVE) ×3 IMPLANT
GLOVE BIOGEL PI IND STRL 8 (GLOVE) ×1 IMPLANT
GLOVE BIOGEL PI INDICATOR 8 (GLOVE) ×2
GOWN STRL NON-REIN LRG LVL3 (GOWN DISPOSABLE) ×4 IMPLANT
GOWN STRL REIN XL XLG (GOWN DISPOSABLE) ×3 IMPLANT
HEMOSTAT SNOW SURGICEL 2X4 (HEMOSTASIS) ×2 IMPLANT
KIT BASIN OR (CUSTOM PROCEDURE TRAY) ×2 IMPLANT
KIT ROOM TURNOVER OR (KITS) ×2 IMPLANT
NS IRRIG 1000ML POUR BTL (IV SOLUTION) ×2 IMPLANT
PACK SURGICAL SETUP 50X90 (CUSTOM PROCEDURE TRAY) ×2 IMPLANT
PAD ARMBOARD 7.5X6 YLW CONV (MISCELLANEOUS) ×4 IMPLANT
PENCIL BUTTON HOLSTER BLD 10FT (ELECTRODE) ×2 IMPLANT
SHEARS HARMONIC 9CM CVD (BLADE) ×2 IMPLANT
SPECIMEN JAR MEDIUM (MISCELLANEOUS) IMPLANT
SPONGE INTESTINAL PEANUT (DISPOSABLE) ×2 IMPLANT
STAPLER VISISTAT 35W (STAPLE) ×1 IMPLANT
SUT MNCRL AB 4-0 PS2 18 (SUTURE) ×2 IMPLANT
SUT VIC AB 2-0 SH 18 (SUTURE) ×3 IMPLANT
SUT VIC AB 3-0 SH 18 (SUTURE) ×2 IMPLANT
SUT VICRYL AB 2 0 TIES (SUTURE) ×2 IMPLANT
SUT VICRYL AB 3 0 TIES (SUTURE) ×2 IMPLANT
SYR BULB 3OZ (MISCELLANEOUS) ×2 IMPLANT
TOWEL OR 17X24 6PK STRL BLUE (TOWEL DISPOSABLE) ×1 IMPLANT
TOWEL OR 17X26 10 PK STRL BLUE (TOWEL DISPOSABLE) ×2 IMPLANT
TUBE CONNECTING 12X1/4 (SUCTIONS) ×2 IMPLANT
WATER STERILE IRR 1000ML POUR (IV SOLUTION) IMPLANT

## 2013-07-17 NOTE — Interval H&P Note (Signed)
History and Physical Interval Note:  07/17/2013 8:46 AM  Ashley Dudley  has presented today for surgery, with the diagnosis of thyroid nodule  The various methods of treatment have been discussed with the patient and family. After consideration of risks, benefits and other options for treatment, the patient has consented to  Procedure(s): COMPLETION THYROIDECTOMY (N/A) as a surgical intervention .  The patient's history has been reviewed, patient examined, no change in status, stable for surgery.  I have reviewed the patient's chart and labs.  Questions were answered to the patient's satisfaction.     Kyriakos Babler A.

## 2013-07-17 NOTE — Anesthesia Preprocedure Evaluation (Addendum)
Anesthesia Evaluation  Patient identified by MRN, date of birth, ID band Patient awake    Reviewed: Allergy & Precautions, H&P , NPO status , Patient's Chart, lab work & pertinent test results  Airway Mallampati: I TM Distance: >3 FB Neck ROM: Full    Dental   Pulmonary          Cardiovascular     Neuro/Psych    GI/Hepatic   Endo/Other  Hypothyroidism   Renal/GU      Musculoskeletal   Abdominal   Peds  Hematology   Anesthesia Other Findings   Reproductive/Obstetrics                          Anesthesia Physical Anesthesia Plan  ASA: II  Anesthesia Plan: General   Post-op Pain Management:    Induction: Intravenous  Airway Management Planned: Oral ETT  Additional Equipment:   Intra-op Plan:   Post-operative Plan: Extubation in OR  Informed Consent: I have reviewed the patients History and Physical, chart, labs and discussed the procedure including the risks, benefits and alternatives for the proposed anesthesia with the patient or authorized representative who has indicated his/her understanding and acceptance.     Plan Discussed with: Surgeon and CRNA  Anesthesia Plan Comments:         Anesthesia Quick Evaluation

## 2013-07-17 NOTE — Transfer of Care (Signed)
Immediate Anesthesia Transfer of Care Note  Patient: Ashley Dudley  Procedure(s) Performed: Procedure(s): COMPLETION THYROIDECTOMY (N/A)  Patient Location: PACU  Anesthesia Type:General  Level of Consciousness: awake, alert , oriented and sedated  Airway & Oxygen Therapy: Patient Spontanous Breathing and Patient connected to nasal cannula oxygen  Post-op Assessment: Report given to PACU RN, Post -op Vital signs reviewed and stable and Patient moving all extremities  Post vital signs: Reviewed and stable  Complications: No apparent anesthesia complications

## 2013-07-17 NOTE — Op Note (Signed)
Thyroid Lobectomy Procedure Note Completion  Indications: This patient presents with a palpable nodule on the right side of the neck.  FNA non diagnostic.  Pt wished to proceed with completion thyroidectomy since hypothyroid and history of left thyroidectomy 20 years ago.  Risks of bleeding,  Infection,  Low calcium,  Nerve injury with hoarseness and airway compromise,  And need for other operations discussed.   The patient now presents for a thyroid lobectomy. The procedure has been discussed with the patient.  Alternative therapies have been discussed with the patient.  Operative risks include bleeding,  Infection,  Organ injury,  Nerve injury,  Blood vessel injury,  DVT,  Pulmonary embolism,  Death,  And possible reoperation.  Medical management risks include worsening of present situation.  The success of the procedure is 50 -90 % at treating patients symptoms.  The patient understands and agrees to proceed.  Pre-operative Diagnosis: right thyroid nodule  Post-operative Diagnosis:  same  Surgeon: Harriette Bouillon A.   Assistants: Dr Derrell Lolling  Anesthesia: General endotracheal anesthesia  ASA Class: 2  Procedure Details  The patient was seen in the Holding Room. The risks, benefits, complications, treatment options, and expected outcomes were discussed with the patient. The possibilities of reaction to medication, pulmonary aspiration, perforation of viscus, bleeding, recurrent infection, finding a normal thyroid, recurrently laryngeal nerve damage, the need for additional procedures, failure to diagnose a condition, and creating a complication requiring transfusion or operation were discussed with the patient. The patient concurred with the proposed plan, giving informed consent.  The site of surgery properly noted/marked. The patient was taken to Operating Room # 2, identified as Ashley Dudley and the procedure verified as  Completion Thyroidectomy. A Time Out was held and the above information  confirmed.  The patient was placed supine after induction of a general anesthetic.  The neck was extended and prepped and draped in standard fashion.  A 6 cm transverse cervical incision was created above the sternal notch within an old scar.  The strap muscles were identified and divided at the midline.  Sharp dissection was used to mobilize the right  thyroid lobe in a medial direction.  Dissection continued posteriorly to expose the tracheoesophageal groove and right carotid artery.  The recurrent laryngeal nerve was identified and preserved.  The thyroid lobe was mobilized further and the superior and inferior pole vessels were divided with the harmonic scalpel.  The middle thyroid vein was divided with the harmonic scalpel.  Small vessels were likewise divided.  The gland was rotated in a medial direction and taken off the trachea using the harmonic scalpel.  The isthmus was removed off the thyroid cartilage using the harmonic scalpel.  The specimen was submitted to pathology.  A suture was placed for orientation purposes. A 1 cm right paratracheal cystic mass identified and removed.   The wound was irrigated and inspected carefully.  The parathyroid tissue was found to be viable and the recurrent laryngeal nerve was left intact in its anatomic locations. Ashley Dudley was placed into surgical bed.  The strap muscles were closed with interrupted 3-0 Vicryl suture.  The platysma was closed with interrupted 3-0 Vicryl suture, and the skin incision was closed with a 4-0 Vicryl subcuticular closure. Dermabond was applied across the incision.  Instrument, sponge, and needle counts were correct prior to closure and at the conclusion of the case.   Findings: The right recurrent laryngeal nerve was identified.  Parathyroid tissue was identified and preserved with a viable blood  supply.  A nodule was found within the thyroid lobe, located in the upper pole.  This measured approximately 3 cm in diameter and mobile from  surrounding structures.   Estimated Blood Loss:  less than 50 mL         Drains: none         Total IV Fluids: 500 ml         Specimens: above          Complications:  None; patient tolerated the procedure well.         Disposition: PACU - hemodynamically stable.         Condition: stable  Attending Attestation: I performed the procedure.

## 2013-07-17 NOTE — Progress Notes (Signed)
Report given to diane walker rn as caregiver 

## 2013-07-17 NOTE — H&P (Signed)
Transplants  Currently admitted as of 07/17/2013  Specialty CommentsEditShow AllReport      05/21/2013:MAY RELEASE MEDICAL INFO TO MICHAELEEN DOWN 02/05/1960 DOS: 07/17/13 TC/[facility]-OPB-thyroid/gen 60240 KH  06/25/2013 patient scheduled for 23 hrs observation 07/17/2013 @ [facility] per [phone] Latoya D no precert required. (skm,chm)    None   Medications      Hospital Medications Outpatient Medications  Demographics  New medications from outside sources are available for reconciliation   ceFAZolin (ANCEF) IVPB 2 g/50 mL premix     chlorhexidine (HIBICLENS) 4 % liquid 1 application     lactated ringers infusion     Tilda Franco 47 year old female  Comm Pref:  [address]  [place] [address] [phone] (M) Works at OTHER [[employer]     Problem ListHospitalization Problem   Preferred Labs        None  None   Significant History/Details   &&&Transplant-Related Biopsies (11 years)      ** None **   Smoking: Never Smoker   &&&Patient Blood Type (50 years)    Smokeless Tobacco: Never Used  None   Alcohol: Yes    1 open order    Preferred Language: English    Dialysis History     None                     &&&Recent Visits (Maximum of 10 visits)   Date Type Provider Description  07/17/2013 Surgery Manisha Cancel A., MD   06/25/2013 Orders Only Harriette Bouillon A., MD   06/22/2013 Telephone Dortha Schwalbe., MD   06/21/2013 Telephone Dortha Schwalbe., MD   06/01/2013 Telephone Leonor Liv, RN   06/01/2013 Office Visit Mckynzi Cammon A., MD History of Thyroid Disease (Primary Dx); Thyroid Nodule  05/25/2013 Telephone Brennan Bailey, [place]   05/21/2013 Office Visit Dortha Schwalbe., MD Thyroid Nodule (Primary Dx)       My Last Outpatient Progress Note   Status Last Edited Encounter Date  Signed Fri Jun 01, 2013 12:30 PM EDT 06/01/2013  Subjective:    Patient ID: Ashley Dudley, female DOB: [birth year], 47 y.o. MRN: [id number]  HPI  Patient returns  after fine needle aspiration of right thyroid nodule. This was nondiagnostic. She is here to discuss next step.  Review of Systems  Constitutional: Negative.  HENT: Negative.  Respiratory: Negative.  Cardiovascular: Negative.  Endocrine: Negative.      Objective:    Physical Exam  Constitutional: She appears well-developed and well-nourished.  HENT:  Head: Normocephalic and atraumatic.  Psychiatric: She has a normal mood and affect. Her behavior is normal. Judgment and thought content normal.      Assessment:     right thyroid nodule of undetermined significance status post nondiagnostic fine needle aspiration with history of hypothyroidism and previous left thyroid lobectomy for benign disease     Plan:     Discussed repeating her fine needle aspiration versus continued observation since the nodules been biopsied in the past versus completion thyroidectomy. Also scenarios discussed with the patient and husband today.the risks and benefits of each discussed. She is leaning toward completion thyroidectomy. Will refer to ENT for laryngoscopy prior to any surgical intervention to evaluate cord function. She will call back to schedule or to follow depending on what they decide. Risks, benefits and alternatives of thyroidectomy discussed with the patient and husband today. Risk of bleeding, infection, damage to nerves in the neck causing voice and breathing problems, low calcium, injury to neighboring  structures of the trachea, esophagus and major blood vessels discussed.

## 2013-07-17 NOTE — Anesthesia Procedure Notes (Signed)
Procedure Name: Intubation Date/Time: 07/17/2013 10:15 AM Performed by: Harriette Bouillon A. Pre-anesthesia Checklist: Patient identified, Emergency Drugs available, Suction available, Patient being monitored and Timeout performed Patient Re-evaluated:Patient Re-evaluated prior to inductionOxygen Delivery Method: Circle system utilized Preoxygenation: Pre-oxygenation with 100% oxygen Intubation Type: IV induction Ventilation: Mask ventilation without difficulty Laryngoscope Size: Miller and 2 Grade View: Grade I Number of attempts: 1 Airway Equipment and Method: Stylet Secured at: 22 cm Tube secured with: Tape Dental Injury: Teeth and Oropharynx as per pre-operative assessment

## 2013-07-17 NOTE — Preoperative (Signed)
Beta Blockers   Reason not to administer Beta Blockers:Not Applicable 

## 2013-07-17 NOTE — Interval H&P Note (Signed)
History and Physical Interval Note:  07/17/2013 8:27 AM  Ashley Dudley  has presented today for surgery, with the diagnosis of thyroid nodule  The various methods of treatment have been discussed with the patient and family. After consideration of risks, benefits and other options for treatment, the patient has consented to  Procedure(s): COMPLETION THYROIDECTOMY (N/A) as a surgical intervention .  The patient's history has been reviewed, patient examined, no change in status, stable for surgery.  I have reviewed the patient's chart and labs.  Questions were answered to the patient's satisfaction.  Pt seen by ENT AND CORD FUNCTION NORMAL.     [name]

## 2013-07-17 NOTE — Anesthesia Postprocedure Evaluation (Signed)
Anesthesia Post Note  Patient: Ashley Dudley  Procedure(s) Performed: Procedure(s) (LRB): COMPLETION THYROIDECTOMY (N/A)  Anesthesia type: general  Patient location: PACU  Post pain: Pain level controlled  Post assessment: Patient's Cardiovascular Status Stable  Last Vitals:  Filed Vitals:   07/17/13 1313  BP: 133/89  Pulse: 66  Temp: 36.6 C  Resp: 16    Post vital signs: Reviewed and stable  Level of consciousness: sedated  Complications: No apparent anesthesia complications

## 2013-07-18 ENCOUNTER — Encounter (HOSPITAL_COMMUNITY): Payer: Self-pay | Admitting: Surgery

## 2013-07-18 ENCOUNTER — Telehealth (INDEPENDENT_AMBULATORY_CARE_PROVIDER_SITE_OTHER): Payer: Self-pay

## 2013-07-18 LAB — CBC
Hemoglobin: 12.5 g/dL (ref 12.0–15.0)
MCH: 30.9 pg (ref 26.0–34.0)
MCHC: 33.6 g/dL (ref 30.0–36.0)
MCV: 91.9 fL (ref 78.0–100.0)
Platelets: 192 10*3/uL (ref 150–400)
RBC: 4.05 MIL/uL (ref 3.87–5.11)
RDW: 12.8 % (ref 11.5–15.5)

## 2013-07-18 LAB — COMPREHENSIVE METABOLIC PANEL
ALT: 14 U/L (ref 0–35)
AST: 15 U/L (ref 0–37)
Albumin: 3.2 g/dL — ABNORMAL LOW (ref 3.5–5.2)
CO2: 25 mEq/L (ref 19–32)
Calcium: 7.7 mg/dL — ABNORMAL LOW (ref 8.4–10.5)
Creatinine, Ser: 0.72 mg/dL (ref 0.50–1.10)
GFR calc non Af Amer: >90 mL/min (ref >90)
Sodium: 137 mEq/L (ref 135–145)
Total Protein: 6.1 g/dL (ref 6.0–8.3)

## 2013-07-18 MED ORDER — ONDANSETRON HCL 4 MG PO TABS: 4.0000 mg | ORAL_TABLET | Freq: Four times a day (QID) | ORAL | Status: DC | PRN

## 2013-07-18 MED ORDER — PROMETHAZINE HCL 25 MG PO TABS: 12.5000 mg | ORAL_TABLET | Freq: Four times a day (QID) | ORAL | Status: DC | PRN

## 2013-07-18 MED ORDER — CALCIUM CARBONATE ANTACID 500 MG PO CHEW
800.0000 mg | CHEWABLE_TABLET | Freq: Three times a day (TID) | ORAL | Status: DC
  Filled 2013-07-18: qty 2

## 2013-07-18 MED ORDER — METOCLOPRAMIDE HCL 5 MG/ML IJ SOLN
10.0000 mg | Freq: Four times a day (QID) | INTRAMUSCULAR | Status: DC
  Administered 2013-07-18: 10 mg via INTRAVENOUS
  Filled 2013-07-18 (×7): qty 2

## 2013-07-18 MED ORDER — OXYCODONE-ACETAMINOPHEN 5-325 MG PO TABS
1.0000 | ORAL_TABLET | ORAL | Status: DC | PRN
Start: 2013-07-18 — End: 2013-07-30

## 2013-07-18 MED ORDER — CALCIUM CARBONATE ANTACID 500 MG PO CHEW: 800.0000 mg | CHEWABLE_TABLET | Freq: Two times a day (BID) | ORAL | Status: DC

## 2013-07-18 MED ORDER — CALCIUM CARBONATE ANTACID 500 MG PO CHEW
4.0000 | CHEWABLE_TABLET | Freq: Three times a day (TID) | ORAL | Status: DC
  Administered 2013-07-18 – 2013-07-19 (×3): 800 mg via ORAL
  Filled 2013-07-18 (×2): qty 4
  Filled 2013-07-18: qty 2
  Filled 2013-07-18: qty 4
  Filled 2013-07-18: qty 2
  Filled 2013-07-18: qty 4

## 2013-07-18 MED ORDER — SODIUM CHLORIDE 0.9 % IV BOLUS (SEPSIS)
1000.0000 mL | Freq: Once | INTRAVENOUS | Status: AC
  Administered 2013-07-18: 1000 mL via INTRAVENOUS

## 2013-07-18 MED ORDER — PROMETHAZINE HCL 12.5 MG PO TABS: 12.5000 mg | ORAL_TABLET | Freq: Four times a day (QID) | ORAL | Status: DC | PRN

## 2013-07-18 MED ORDER — POTASSIUM CHLORIDE 10 MEQ/100ML IV SOLN
10.0000 meq | INTRAVENOUS | Status: AC
  Administered 2013-07-18 (×3): 10 meq via INTRAVENOUS
  Filled 2013-07-18 (×3): qty 100

## 2013-07-18 NOTE — Telephone Encounter (Signed)
Epic msg. Post op appt 12/15 @ 10:30

## 2013-07-18 NOTE — Discharge Summary (Addendum)
Physician Discharge Summary  Patient ID: RENALDA LOCKLIN MRN: [id number] DOB/AGE: 1966/07/26 47 y.o.  Admit date: 07/17/2013 Discharge date: 07/18/2013  Admission Diagnoses:thyroid nodule Patient Active Problem List   Diagnosis Date Noted  . Thyroid nodule 07/17/2013    Discharge Diagnoses:  Active Problems:   Thyroid nodule   Discharged Condition: good  Hospital Course: Pt had PONV treated with IVF and antiemetics. Replaced potassium and calcium and started additional calcium replacement on top of viactin. No hoarseness or difficulty breathing.  No perioral or carpopedal symptoms.   Consults: None  Significant Diagnostic Studies: labs:  CMP     Component Value Date/Time   NA 137 07/18/2013 0342   K 3.3* 07/18/2013 0342   CL 100 07/18/2013 0342   CO2 25 07/18/2013 0342   GLUCOSE 123* 07/18/2013 0342   BUN 8 07/18/2013 0342   CREATININE 0.72 07/18/2013 0342   CALCIUM 7.7* 07/18/2013 0342   PROT 6.1 07/18/2013 0342   ALBUMIN 3.2* 07/18/2013 0342   AST 15 07/18/2013 0342   ALT 14 07/18/2013 0342   ALKPHOS 74 07/18/2013 0342   BILITOT 0.4 07/18/2013 0342   GFRNONAA >90 07/18/2013 0342   GFRAA >90 07/18/2013 0342     Treatments: surgery: completion thyroidectomy  Discharge Exam: Blood pressure 121/76, pulse 94, temperature 98.9 F (37.2 C), temperature source Oral, resp. rate 18, height 5\' 6"  (1.676 m), weight 161 lb 14.4 oz (73.437 kg), last menstrual period 12/15/2012, SpO2 99.00%. Incision/Wound:no hematoma mild swelling C/D/I no carpopedal symptoms   Disposition: 01-Home or Self Care  Discharge Orders   Future Orders Complete By Expires   Diet - low sodium heart healthy  As directed    Discharge wound care:  As directed    Comments:     Ice packs as tolerated.  Ok to shower   Driving Restrictions  As directed    Comments:     10 days   Increase activity slowly  As directed    Lifting restrictions  As directed    Comments:     No lifting for 2 weeks       Medication  List         Alfalfa 500 MG Tabs  Take 500 mg by mouth daily with breakfast.     calcium carbonate 500 MG chewable tablet  Commonly known as:  TUMS - dosed in mg elemental calcium  Chew 4 tablets (800 mg of elemental calcium total) by mouth 2 (two) times daily.     glucosamine-chondroitin 500-400 MG tablet  Take 1 tablet by mouth daily with breakfast.     GLUCOSAMINE-CHONDROITIN PO  Take 1 capsule by mouth daily. 330/250 mg     multivitamin with minerals tablet  Take 1 tablet by mouth daily with breakfast.     ondansetron 4 MG tablet  Commonly known as:  ZOFRAN  Take 1 tablet (4 mg total) by mouth every 6 (six) hours as needed for nausea.     oxyCODONE-acetaminophen 5-325 MG per tablet  Commonly known as:  PERCOCET/ROXICET  Take 1-2 tablets by mouth every 4 (four) hours as needed for moderate pain.     promethazine 12.5 MG tablet  Commonly known as:  PHENERGAN  Take 1 tablet (12.5 mg total) by mouth every 6 (six) hours as needed for nausea or vomiting.     thyroid 30 MG tablet  Commonly known as:  ARMOUR  Take 30 mg by mouth daily before breakfast. 1 hour prior to breakfast  VIACTIV PO  Take 1,000 mg by mouth daily with breakfast.     vitamin C 250 MG tablet  Commonly known as:  ASCORBIC ACID  Take 250 mg by mouth daily with breakfast.         Signed: Remo Kirschenmann A. 07/18/2013, 8:02 AM  Pt kept additional night due to low calcium and elevated heart rate.  This improved overnight and pt pt well without any additional sxs.  Calcium improved to 8.1 and pt felt better.  Pt felt HR increase secondary to anxiety.  No SOB,  CP,  HYPOXEMIA N/V or other complaints.  Told her to contact primary care if this is an issue at home but today she is in NSR and has a rate of 100 by my exam.  Pt and husband voice their understanding.

## 2013-07-18 NOTE — Progress Notes (Signed)
1 Day Post-Op  Subjective: Pt with nausea over night. No perioral numbness or carpopedal symptoms.    Objective: Vital signs in last 24 hours: Temp:  [97.2 F (36.2 C)-98.9 F (37.2 C)] 98.9 F (37.2 C) (12/03 0602) Pulse Rate:  [66-111] 94 (12/03 0602) Resp:  [12-20] 18 (12/03 0602) BP: (121-149)/(75-98) 121/76 mmHg (12/03 0602) SpO2:  [95 %-100 %] 99 % (12/03 0602) Weight:  [161 lb 14.4 oz (73.437 kg)] 161 lb 14.4 oz (73.437 kg) (12/02 1500) Last BM Date: 07/16/13  Intake/Output from previous day: 12/02 0701 - 12/03 0700 In: 2507.5 [I.V.:2507.5] Out: 1517 [Urine:1202; Emesis/NG output:300; Blood:15] Intake/Output this shift:    Incision/Wound:clean dry intact with mild swelling.  No hoarseness.    Lab Results:   Recent Labs  07/18/13 0342  WBC 13.9*  HGB 12.5  HCT 37.2  PLT 192   BMET  Recent Labs  07/18/13 0342  NA 137  K 3.3*  CL 100  CO2 25  GLUCOSE 123*  BUN 8  CREATININE 0.72  CALCIUM 7.7*   PT/INR No results found for this basename: LABPROT, INR,  in the last 72 hours ABG No results found for this basename: PHART, PCO2, PO2, HCO3,  in the last 72 hours  Studies/Results: No results found.  Anti-infectives: Anti-infectives   Start     Dose/Rate Route Frequency Ordered Stop   07/17/13 0600  ceFAZolin (ANCEF) IVPB 2 g/50 mL premix     2 g 100 mL/hr over 30 Minutes Intravenous On call to O.R. 07/16/13 1600 07/17/13 1017      Assessment/Plan: s/p Procedure(s): COMPLETION THYROIDECTOMY (N/A) Hypokalemia   Replace today Hypocalcemia  /  Hypoalbuminemia   Add Tums 2 tablets 3 times a day will recheck as outpatient.  Cont Armour at present dose.  Home later if nausea resolves.  Will add phenergan  And reglan if needed.  OOB Ambulate  LOS: 1 day    Patryce Depriest A. 07/18/2013

## 2013-07-19 LAB — COMPREHENSIVE METABOLIC PANEL
ALT: 12 U/L (ref 0–35)
BUN: 6 mg/dL (ref 6–23)
CO2: 26 mEq/L (ref 19–32)
Calcium: 8.1 mg/dL — ABNORMAL LOW (ref 8.4–10.5)
Chloride: 102 mEq/L (ref 96–112)
Creatinine, Ser: 0.69 mg/dL (ref 0.50–1.10)
GFR calc Af Amer: >90 mL/min (ref >90)
GFR calc non Af Amer: >90 mL/min (ref >90)
Glucose, Bld: 90 mg/dL (ref 70–99)
Sodium: 140 mEq/L (ref 135–145)
Total Protein: 6.6 g/dL (ref 6.0–8.3)

## 2013-07-19 MED ORDER — OXYCODONE-ACETAMINOPHEN 5-325 MG PO TABS: 1.0000 | ORAL_TABLET | ORAL | Status: DC | PRN

## 2013-07-19 MED ORDER — CALCIUM CARBONATE ANTACID 500 MG PO CHEW: 4.0000 | CHEWABLE_TABLET | Freq: Three times a day (TID) | ORAL | Status: DC

## 2013-07-19 NOTE — Progress Notes (Signed)
2 Days Post-Op  Subjective: Pt kept overnight due to elevated HR and low calcium with symptoms of finger tingling. This is better.  HR 90 -122 yesterday.  Now about 100.  Pt denies SOB,  CP, N/V,  Or difficulty breathing.  Pt very anxious to get out of hospital.  HR goes up with stress according to husband.  Slept great and is in good spirits.   Objective: Vital signs in last 24 hours: Temp:  [98.1 F (36.7 C)-99.3 F (37.4 C)] 99.3 F (37.4 C) (12/04 0523) Pulse Rate:  [102-123] 102 (12/04 0523) Resp:  [16-18] 16 (12/04 0523) BP: (110-130)/(63-77) 120/72 mmHg (12/04 0523) SpO2:  [97 %-100 %] 97 % (12/04 0523) Last BM Date: 07/18/13  Intake/Output from previous day: 12/03 0701 - 12/04 0700 In: 1240 [P.O.:240; IV Piggyback:1000] Out: 250 [Urine:250] Intake/Output this shift:    Cardio: NSR rate about 100.  regular.  Incision/Wound:C/D/I no hematoma  Strong voice no hoarseness  Lab Results:   Recent Labs  07/18/13 0342  WBC 13.9*  HGB 12.5  HCT 37.2  PLT 192   BMET  Recent Labs  07/18/13 0342 07/19/13 0400  NA 137 140  K 3.3* 3.3*  CL 100 102  CO2 25 26  GLUCOSE 123* 90  BUN 8 6  CREATININE 0.72 0.69  CALCIUM 7.7* 8.1*   PT/INR No results found for this basename: LABPROT, INR,  in the last 72 hours ABG No results found for this basename: PHART, PCO2, PO2, HCO3,  in the last 72 hours  Studies/Results: No results found.  Anti-infectives: Anti-infectives   Start     Dose/Rate Route Frequency Ordered Stop   07/17/13 0600  ceFAZolin (ANCEF) IVPB 2 g/50 mL premix     2 g 100 mL/hr over 30 Minutes Intravenous On call to O.R. 07/16/13 1600 07/17/13 1017      Assessment/Plan: s/p Procedure(s): COMPLETION THYROIDECTOMY (N/A) OK for discharge  Feels a lot better.  Told her if she has issues with her heart rate to contact her primary MD to be seen. IN NSR at this point and feels anxious.   She has no other symptoms at this point and calcium is better. Will  recheck in 2 weeks.   LOS: 2 days    Toya Palacios A. 07/19/2013

## 2013-07-19 NOTE — Discharge Planning (Signed)
Patient discharged home in stable condition. Verbalizes understanding of all discharge instructions, including home medications and follow up appointments. 

## 2013-07-23 NOTE — Telephone Encounter (Addendum)
Pt called and was given benign pathology results.  She commented that she is having mild pain/soreness at her IV site.  I recommended she apply a warm compress to the area.  If does not improve or becomes more painful/swollen she will call our office.  Pt reminded of her post op appointment.

## 2013-07-30 ENCOUNTER — Encounter (INDEPENDENT_AMBULATORY_CARE_PROVIDER_SITE_OTHER): Payer: Self-pay | Admitting: Surgery

## 2013-07-30 ENCOUNTER — Ambulatory Visit (INDEPENDENT_AMBULATORY_CARE_PROVIDER_SITE_OTHER): Payer: BC Managed Care – PPO | Admitting: Surgery

## 2013-07-30 VITALS — BP 120/72 | HR 80 | Temp 98.5°F | Resp 14 | Ht 66.0 in | Wt 160.4 lb

## 2013-07-30 DIAGNOSIS — E039 Hypothyroidism, unspecified: Secondary | ICD-10-CM

## 2013-07-30 DIAGNOSIS — Z9889 Other specified postprocedural states: Secondary | ICD-10-CM

## 2013-07-30 LAB — CALCIUM: Calcium: 10.2 mg/dL (ref 8.4–10.5)

## 2013-07-30 LAB — TSH: TSH: 4.55 u[IU]/mL — ABNORMAL HIGH (ref 0.350–4.500)

## 2013-07-30 LAB — T4: T4, Total: 3.5 ug/dL — ABNORMAL LOW (ref 5.0–12.5)

## 2013-07-30 NOTE — Progress Notes (Signed)
Patient returns after completion thyroidectomy. The swelling from her incision is improving. She's to a half weeks out from surgery. Pathology showed a Hurthe cell adenoma.  Exam: Cervical incision clean dry and intact with minimal swelling. Her voice is strong.  Pathology:Thyroid, lobectomy, Right - HURTHLE CELL LESION, SEE COMMENT. - BACKGROUND OF LYMPHOCYTIC THYROIDITIS. - BENIGN HYPERPLASTIC THYROID NODULE PRESENT. 2. Soft tissue mass, simple excision, Right Paratracheal - BENIGN THYROID NODULE WITH EVIDENCE OF LYMPHOCYTIC THYROIDITIS.  Impression: Status post completion thyroidectomy  Plan: Check calcium level. Check thyroid function to make sure thyroid replacement adequate. Return to clinic as needed. Recommend followup with primary care early next year to recheck thyroid function.

## 2013-07-30 NOTE — Patient Instructions (Signed)
Return as needed.  Call primary care to follow up next month to have thyroid function follow up.  Will call you if we need to adjust your medications. Full activity.

## 2013-08-08 ENCOUNTER — Telehealth (INDEPENDENT_AMBULATORY_CARE_PROVIDER_SITE_OTHER): Payer: Self-pay | Admitting: *Deleted

## 2013-08-08 NOTE — Telephone Encounter (Signed)
Patient called to ask if she needs to alter her thyroid medication due to TSH being 4.550.  Explained that I would send a message to Dr. Luisa Hart to ask then we will let her know.  Patient states understanding and agreeable at this time.

## 2013-08-08 NOTE — Telephone Encounter (Signed)
Ok for now but will need to see primary care in next couple of weeks for an assessment.  If she is feeling tired she can take one and a half of her ususal dose daily.  Thanks.

## 2013-08-08 NOTE — Telephone Encounter (Signed)
Called and spoke with patient.  Gave patient below message.  She states understanding of the POC at this time and no further questions.

## 2014-05-31 ENCOUNTER — Other Ambulatory Visit: Payer: Self-pay

## 2014-08-16 IMAGING — US US THYROID BIOPSY
1 series · 12 of 12 positions shown · non-contrast
Comparison: None.

CLINICAL DATA: Dominant right lobe nodule. Status post left
hemithyroidectomy.

EXAM:
ULTRASOUND GUIDED NEEDLE ASPIRATE BIOPSY OF THE THYROID GLAND

[Series 1: us thyroid biopsy · 0.08mm/px · 12 acquisitions, 12 frames shown]
[im 1/12]
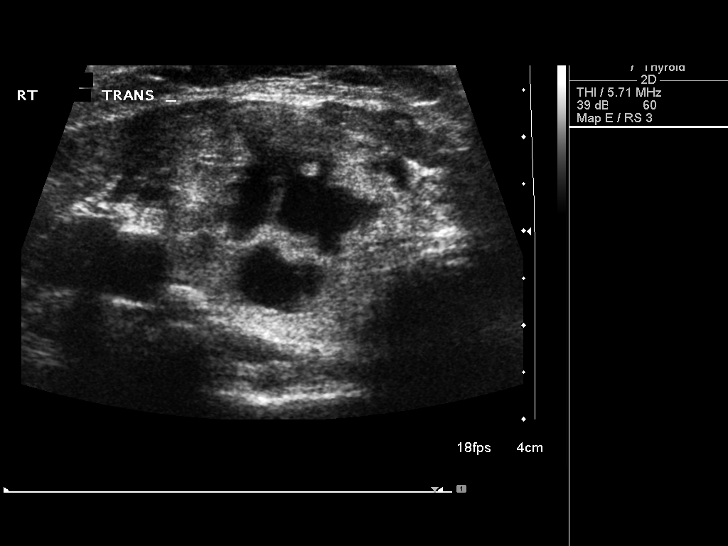
[im 2/12]
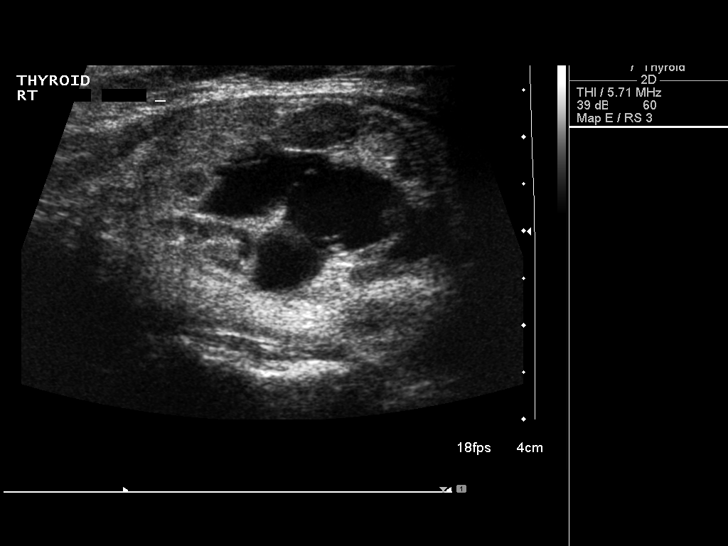
[im 3/12]
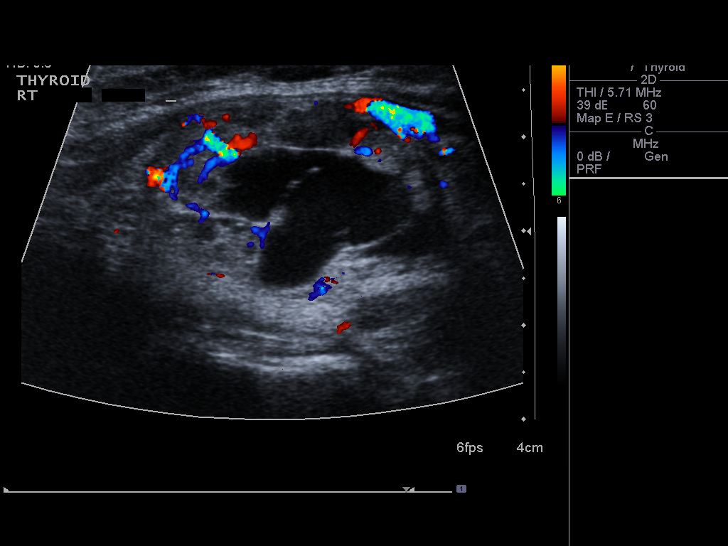
[im 4/12]
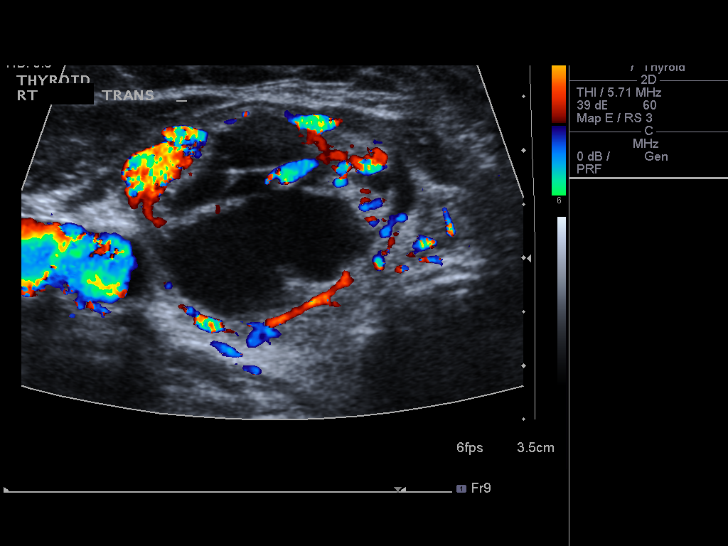
[im 5/12]
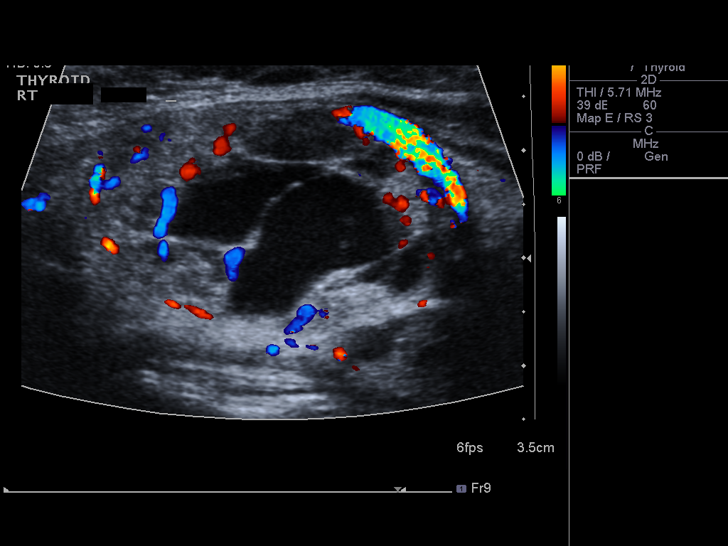
[im 6/12]
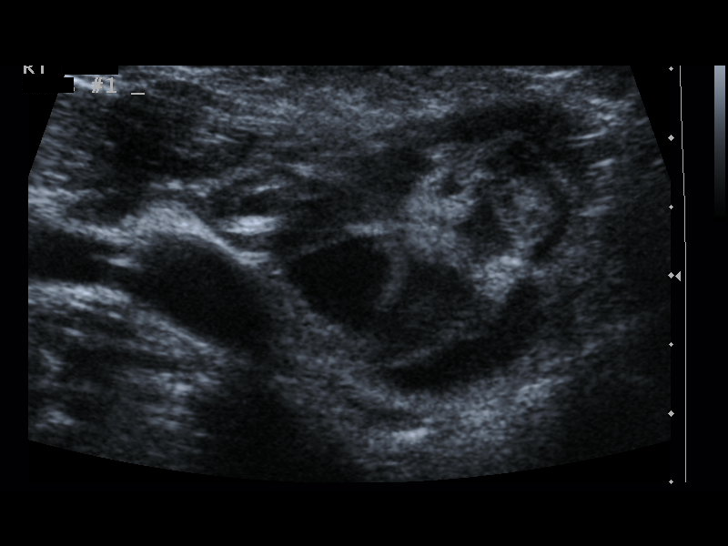
[im 7/12]
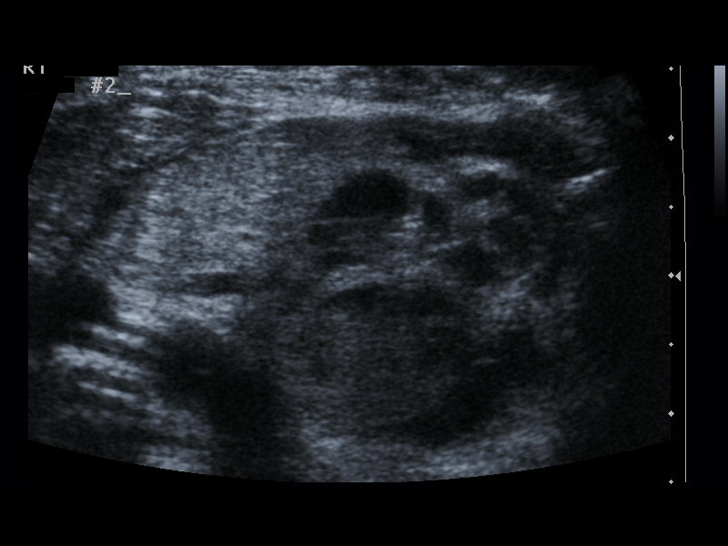
[im 8/12]
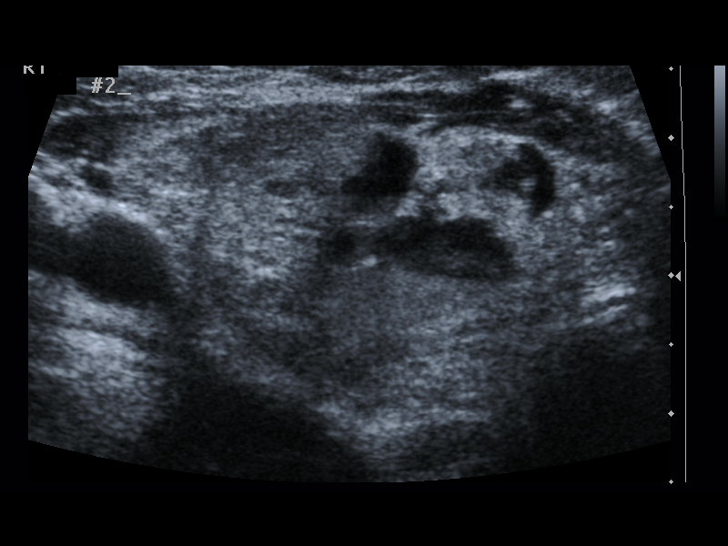
[im 9/12]
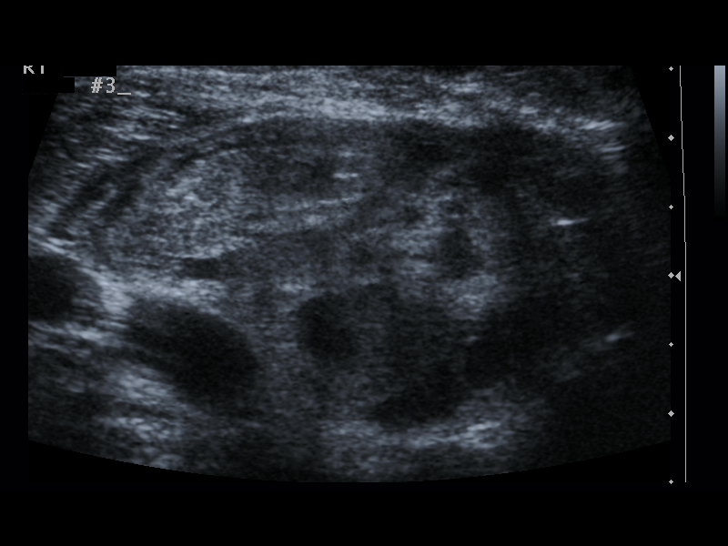
[im 10/12]
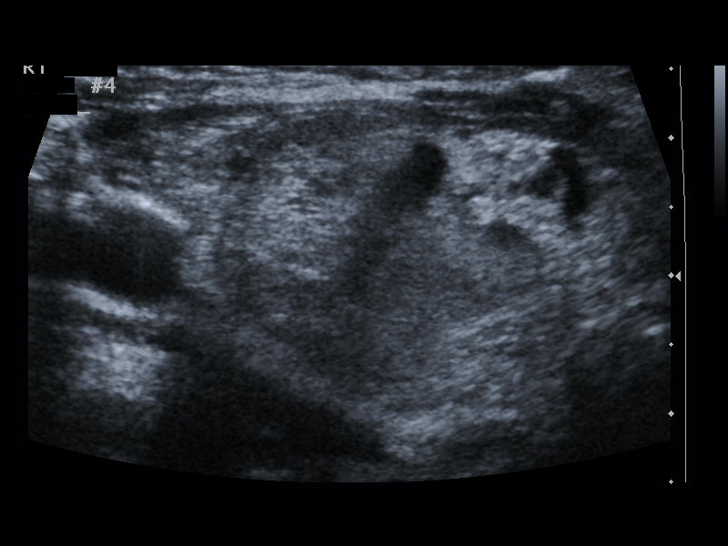
[im 11/12]
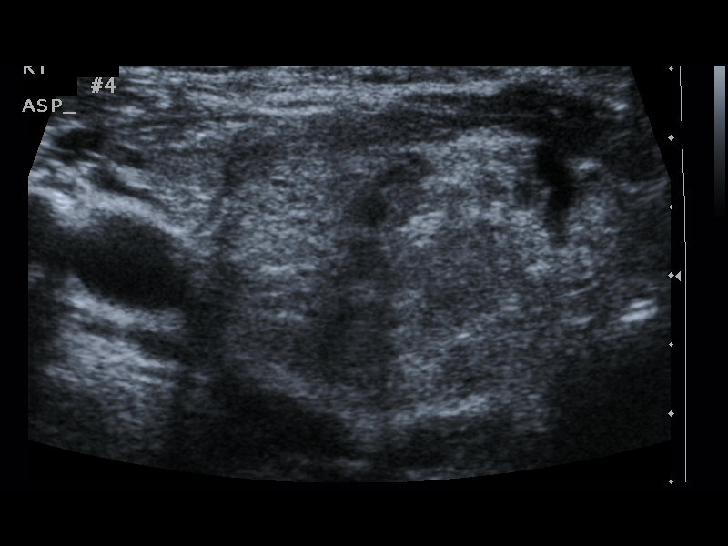
[im 12/12]
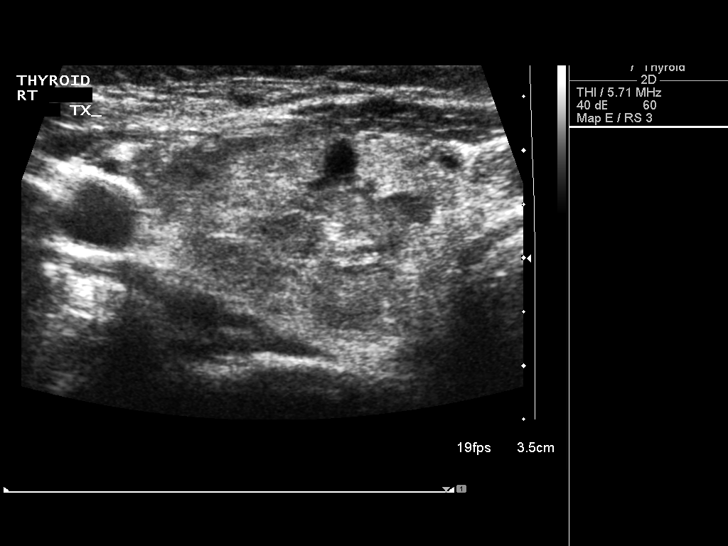

[12 of 12 positions shown; findings below may reference images not displayed]

FINDINGS: Images document needle placement within the dominant nodule in the
right lobe.
IMPRESSION: Ultrasound guided needle aspirate biopsy performed of the right
thyroid nodule.

PROCEDURE:
Thyroid biopsy was thoroughly discussed with the patient and
questions were answered. The benefits, risks, alternatives, and
complications were also discussed. The patient understands and
wishes to proceed with the procedure. Written consent was obtained.

Ultrasound was performed to localize and mark an adequate site for
the biopsy. The patient was then prepped and draped in a normal
sterile fashion. Local anesthesia was provided with 1% lidocaine.
Using direct ultrasound guidance, 3 passes were made using needles
into the nodule within the right lobe of the thyroid. Ultrasound was
used to confirm needle placements on all occasions. Specimens were
sent to Pathology for analysis.

Complications:  None

## 2014-09-29 IMAGING — CR DG CHEST 2V
2 series · 2 of 2 positions shown · non-contrast
Comparison: None.

CLINICAL DATA: Preop thyroidectomy

EXAM:
CHEST  2 VIEW

[w chest pa]
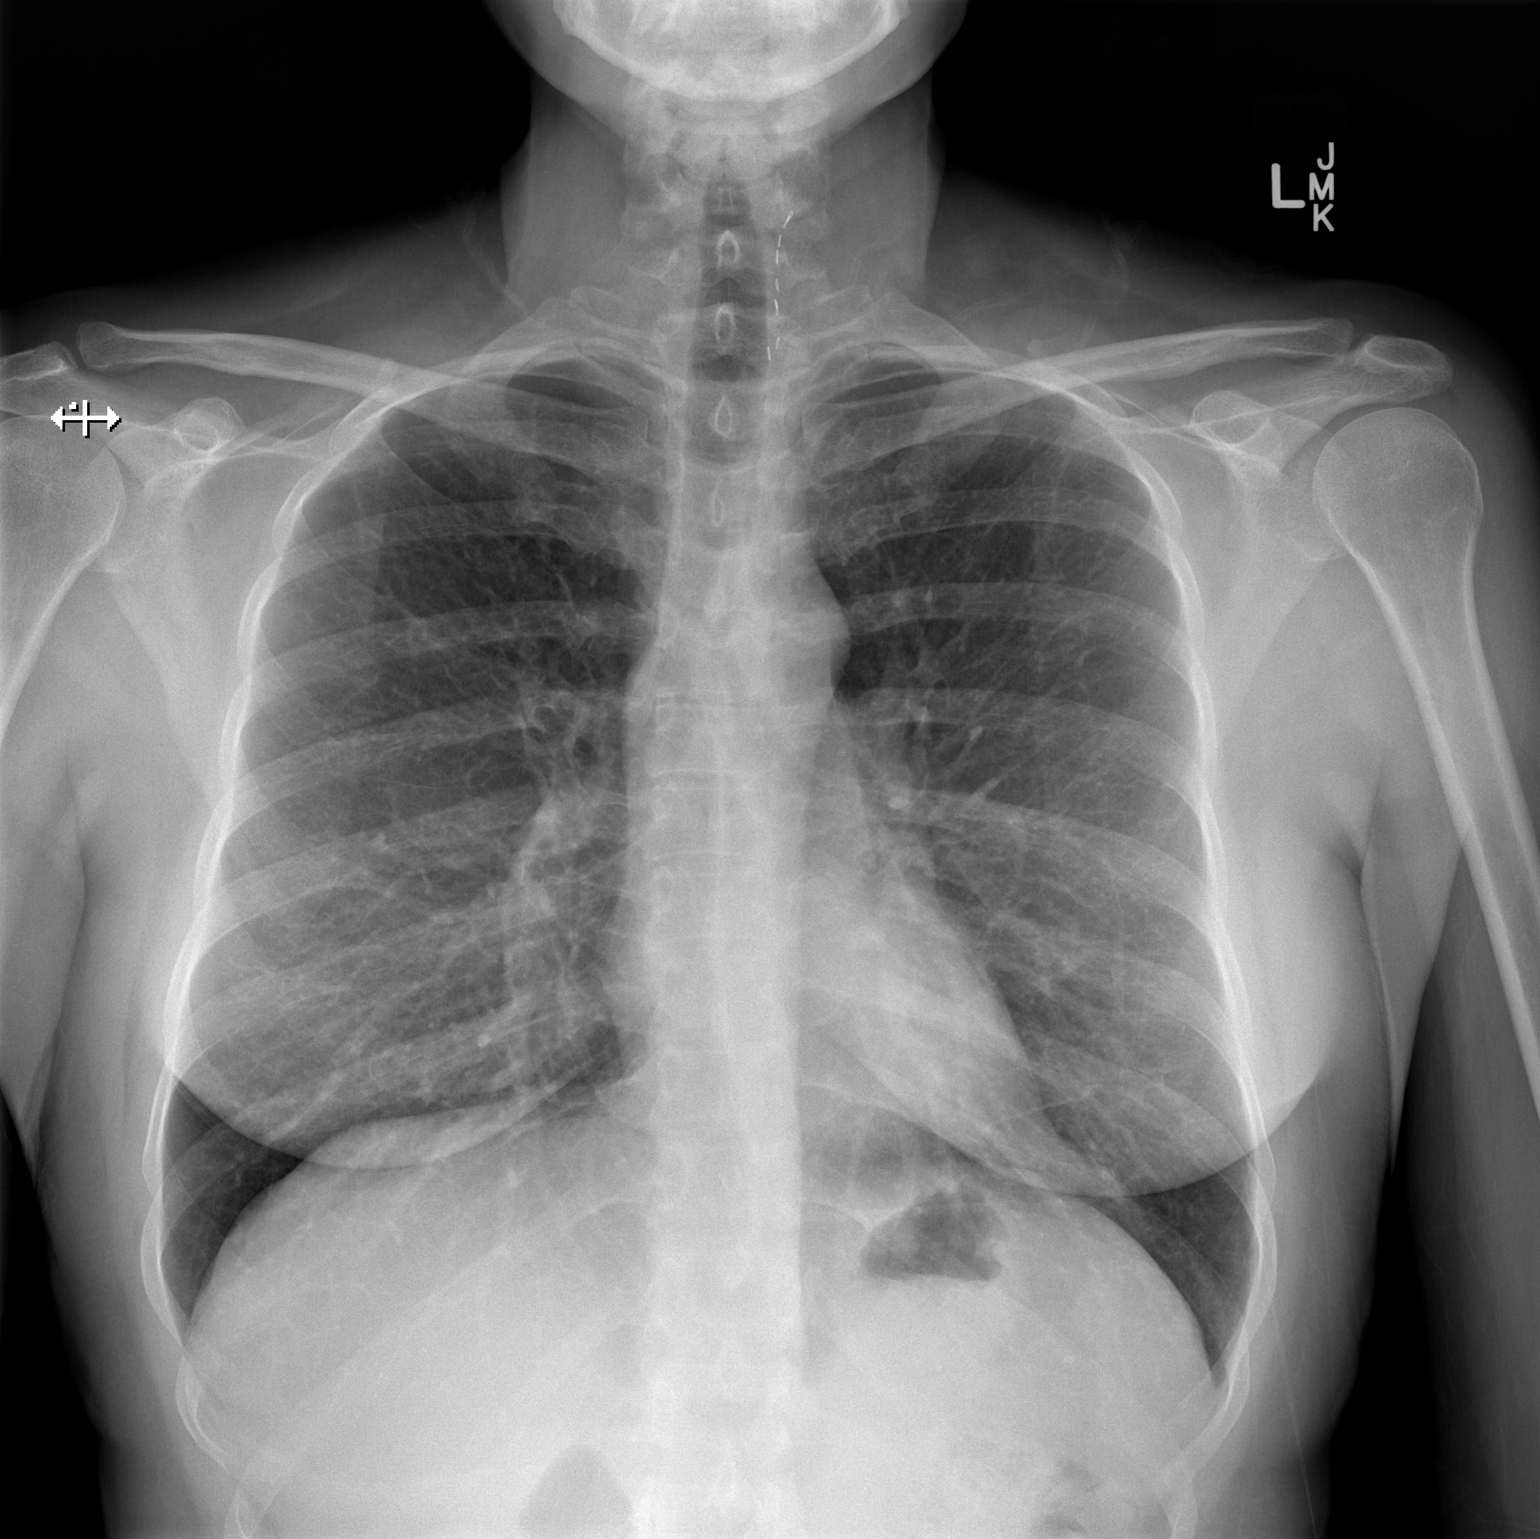

[w chest lat]
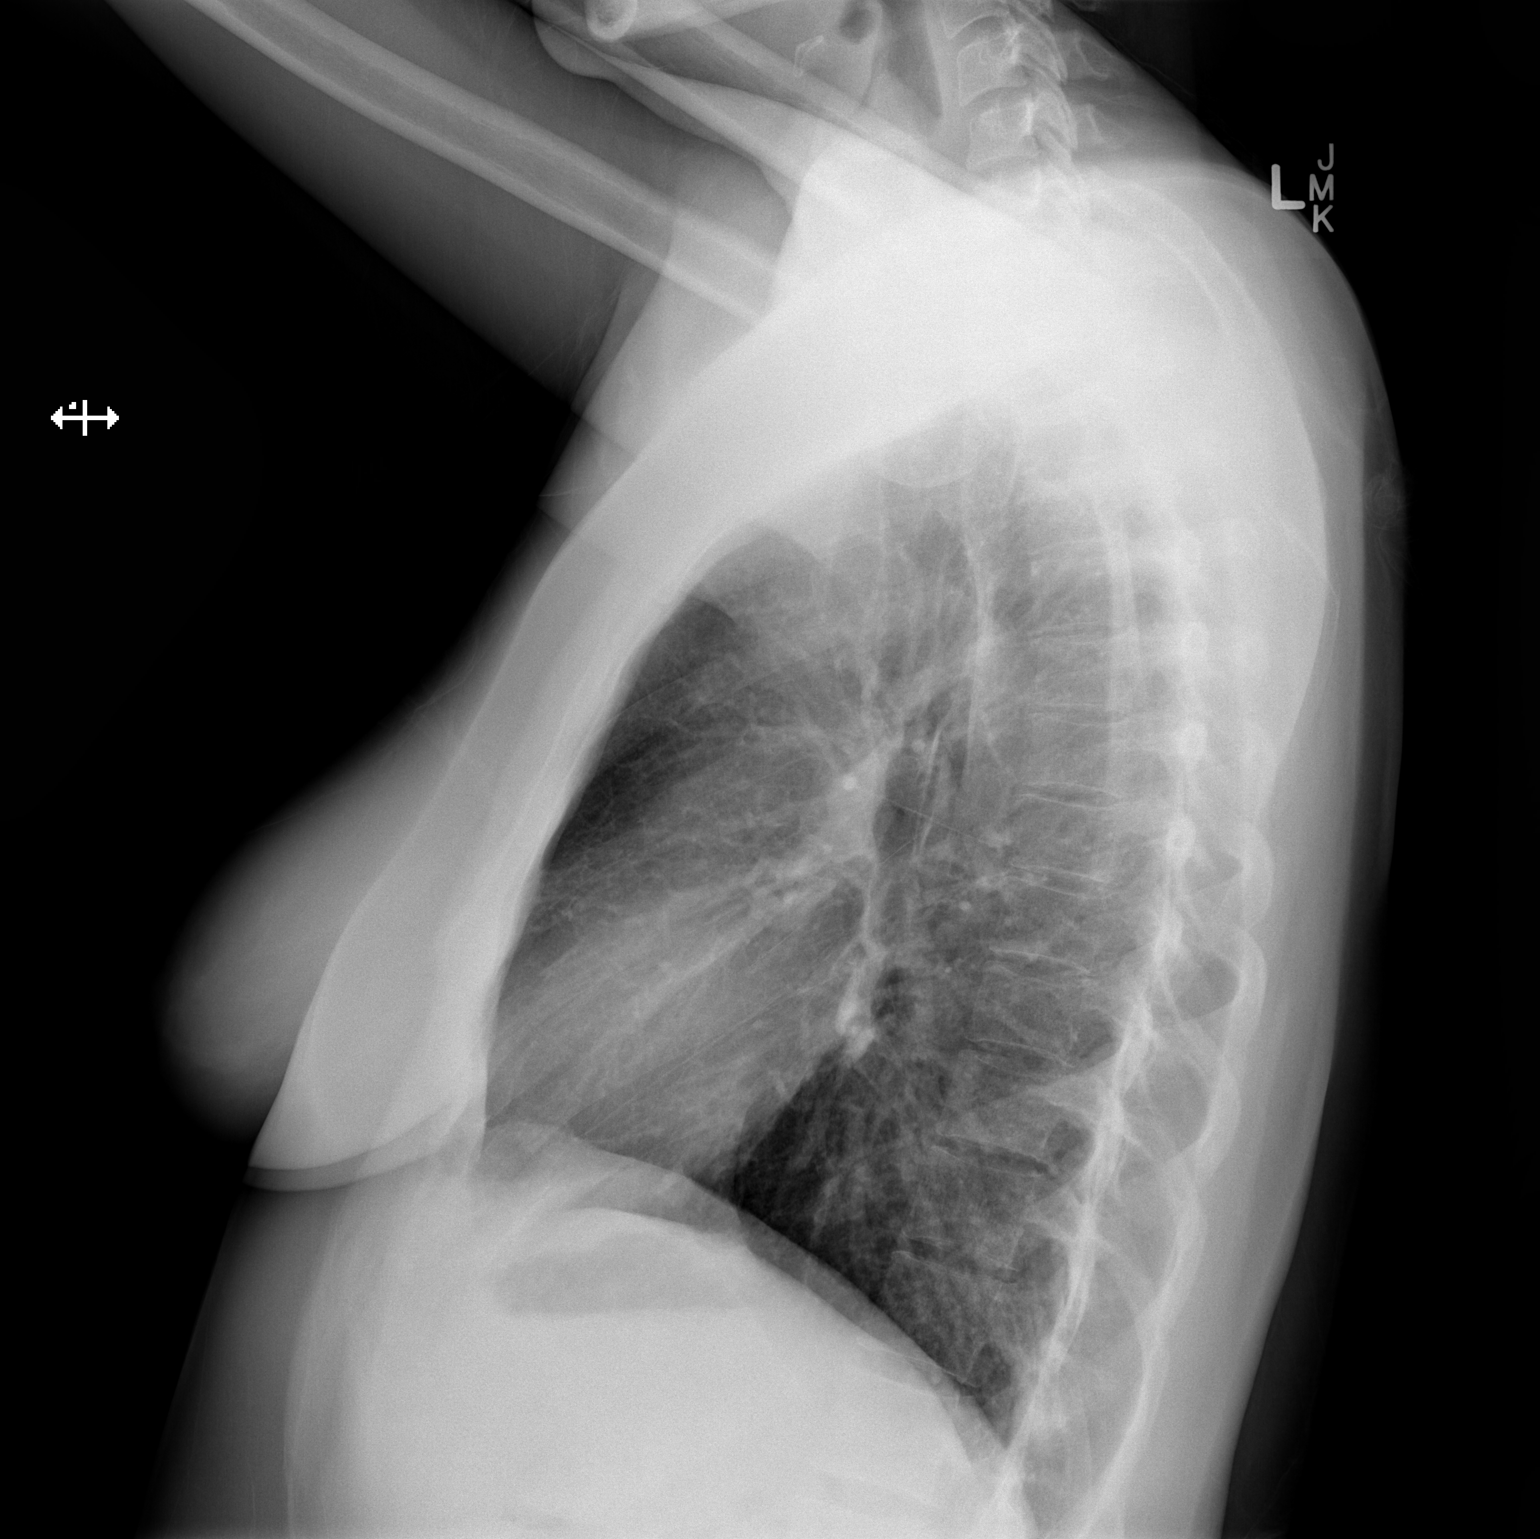

[2 of 2 positions shown; findings below may reference images not displayed]

FINDINGS: The heart size and mediastinal contours are within normal limits.
Both lungs are clear. The visualized skeletal structures are
unremarkable.
IMPRESSION: No active cardiopulmonary disease.

## 2018-02-14 ENCOUNTER — Encounter: Payer: Self-pay | Admitting: Obstetrics & Gynecology

## 2018-02-14 ENCOUNTER — Ambulatory Visit (INDEPENDENT_AMBULATORY_CARE_PROVIDER_SITE_OTHER): Payer: Self-pay | Admitting: Obstetrics & Gynecology

## 2018-02-14 VITALS — BP 130/80 | Ht 65.0 in | Wt 152.6 lb

## 2018-02-14 DIAGNOSIS — N950 Postmenopausal bleeding: Secondary | ICD-10-CM

## 2018-02-14 NOTE — Progress Notes (Signed)
    Ashley Dudley 09/16/1965 [id number]        52 y.o.  G2P2L2 Married.  RP: Referred by Dr. Melinda Crutch family physician for postmenopausal bleeding  HPI: Menopause x age 31, LMP 09/2013.  No HRT.  PMB x 6-7 days mild/moderate flow "menstrual period like" mid June 2019.  No current vaginal bleeding.  No pelvic pain.  No postcoital bleeding.  Normal vaginal secretions.  Urine/BMs wnl.  Previous paps normal.  Hypothyroidism on [place] Thyroid 60 mg daily.  TSH January 2019 was normal at 0.978.   OB History  Gravida Para Term Preterm AB Living  2 2       2   SAB TAB Ectopic Multiple Live Births               # Outcome Date GA Lbr Len/2nd Weight Sex Delivery Anes PTL Lv  2 Para           1 Para             Past medical history,surgical history, problem list, medications, allergies, family history and social history were all reviewed and documented in the EPIC chart.   Directed ROS with pertinent positives and negatives documented in the history of present illness/assessment and plan.  Exam:  Vitals:   02/14/18 1103  BP: 130/80  Weight: 152 lb 9.6 oz (69.2 kg)  Height: 5\' 5"  (1.651 m)   General appearance:  Normal  Abdomen: Normal  Gynecologic exam: Vulva normal.  Speculum:  Cervix/Vagina normal.  Normal vaginal secretions.  Bimanual exam:  Uterus RV, normal volume, NT.  No adnexal mass, NT bilaterally.   Assessment/Plan:  52 y.o. G2P2   1. Postmenopausal bleeding Menopause for 3 years on no hormone replacement therapy with mild postmenopausal bleeding.  Normal gynecologic exam today.  Patient will follow-up to investigate with a pelvic ultrasound, rule out endometrial pathology such as polyps, fibroids, endometrial hyperplasia and endometrial cancer.  Will proceed with endometrial biopsy per pelvic ultrasound results.  Patient informed of today's findings and plan of management and agrees. - US Transvaginal Non-OB; Future  Counseling on above issues and coordination of care more  than 50% for 30 minutes.  Princess Bruins MD, 11:14 AM 02/14/2018

## 2018-02-19 ENCOUNTER — Encounter: Payer: Self-pay | Admitting: Obstetrics & Gynecology

## 2018-02-19 NOTE — Patient Instructions (Signed)
1. Postmenopausal bleeding Menopause for 3 years on no hormone replacement therapy with mild postmenopausal bleeding.  Normal gynecologic exam today.  Patient will follow-up to investigate with a pelvic ultrasound, rule out endometrial pathology such as polyps, fibroids, endometrial hyperplasia and endometrial cancer.  Will proceed with endometrial biopsy per pelvic ultrasound results.  Patient informed of today's findings and plan of management and agrees. - US Transvaginal Non-OB; Future  [place], it was a pleasure meeting you today!

## 2018-03-06 ENCOUNTER — Ambulatory Visit (INDEPENDENT_AMBULATORY_CARE_PROVIDER_SITE_OTHER): Payer: Self-pay | Admitting: Obstetrics & Gynecology

## 2018-03-06 ENCOUNTER — Encounter: Payer: Self-pay | Admitting: Obstetrics & Gynecology

## 2018-03-06 ENCOUNTER — Ambulatory Visit (INDEPENDENT_AMBULATORY_CARE_PROVIDER_SITE_OTHER): Payer: Self-pay

## 2018-03-06 VITALS — BP 126/84

## 2018-03-06 DIAGNOSIS — N950 Postmenopausal bleeding: Secondary | ICD-10-CM

## 2018-03-06 DIAGNOSIS — N839 Noninflammatory disorder of ovary, fallopian tube and broad ligament, unspecified: Secondary | ICD-10-CM

## 2018-03-06 DIAGNOSIS — N838 Other noninflammatory disorders of ovary, fallopian tube and broad ligament: Secondary | ICD-10-CM

## 2018-03-06 NOTE — Progress Notes (Signed)
    TYRICA AFZAL Jul 22, 1966 [id number]        52 y.o.  G2P2L2 Married.  RP: PMB for Pelvic US  HPI: Menopause for more than 3 years on no hormone replacement therapy.  PMB 01/2018.  No recurrence since then.  No pelvic pain.     OB History  Gravida Para Term Preterm AB Living  2 2       2   SAB TAB Ectopic Multiple Live Births               # Outcome Date GA Lbr Len/2nd Weight Sex Delivery Anes PTL Lv  2 Para           1 Para             Past medical history,surgical history, problem list, medications, allergies, family history and social history were all reviewed and documented in the EPIC chart.   Directed ROS with pertinent positives and negatives documented in the history of present illness/assessment and plan.  Exam:  Vitals:   03/06/18 1000  BP: 126/84   General appearance:  Normal  Pelvic US today: T/V images.  Retroverted uterus measuring 7.18 x 5.25 x 3.70 cm.  Intramural fibroid on the left measuring 1.3 x 1.6 cm.  Prominent endometrial line measured at 5.5 mm with no color flow Doppler to the endometrium.  Right ovary normal.  Left ovary with a hyperechoic focus measuring 1.6 x 1.0 x 1.2 cm.  Negative color flow Doppler to that area.  No mass seen in the right or left adnexa.  Arterial color flow Doppler present bilaterally to the ovaries.  Right to the sac free fluid measuring 3.4 x 1.8 x 3.4 cm.  Endometrial Bx procedure:  Vulva normal.  Speculum:  Cervix normal.  Vagina normal.  Betadine prep.  Hurricane spray.  Tenaculum on anterior lip of cervix.  Easy endometrial Bx with pipelle on all IU surfaces.  Specimen light but adequate, sent to pathology.  All instruments removed.  Well tolerated.   Assessment/Plan:  52 y.o. G2P2   1. Postmenopausal bleeding One episode of postmenopausal bleeding in June 2019 on no hormone replacement therapy.   Endometrial line at 5.5 mm.  Endometrial biopsy done without complication.  Management per results.  2. Ovarian mass,  left Small left ovarian hyperechoic focus measuring 1.6 cm.  Negative color flow Doppler at that level.  Decision to repeat a pelvic ultrasound in 3 months to confirm stability. - US Transvaginal Non-OB; Future  Counseling on above issues and coordination of care more than 50% for 15 minutes.  Princess Bruins MD, 10:13 AM 03/06/2018

## 2018-03-08 ENCOUNTER — Encounter: Payer: Self-pay | Admitting: Obstetrics & Gynecology

## 2018-03-08 LAB — TISSUE SPECIMEN

## 2018-03-08 LAB — PATHOLOGY

## 2018-03-08 NOTE — Patient Instructions (Signed)
1. Postmenopausal bleeding One episode of postmenopausal bleeding in June 2019 on no hormone replacement therapy.   Endometrial line at 5.5 mm.  Endometrial biopsy done without complication.  Management per results.  2. Ovarian mass, left Small left ovarian hyperechoic focus measuring 1.6 cm.  Negative color flow Doppler at that level.  Decision to repeat a pelvic ultrasound in 3 months to confirm stability. - US Transvaginal Non-OB; Future  Janasia, it was a pleasure seeing you today!

## 2018-04-11 ENCOUNTER — Other Ambulatory Visit: Payer: Self-pay | Admitting: Anesthesiology

## 2018-04-11 DIAGNOSIS — N838 Other noninflammatory disorders of ovary, fallopian tube and broad ligament: Secondary | ICD-10-CM

## 2018-06-05 ENCOUNTER — Ambulatory Visit: Payer: Self-pay | Admitting: Obstetrics & Gynecology

## 2018-06-05 ENCOUNTER — Other Ambulatory Visit: Payer: Self-pay

## 2021-04-27 ENCOUNTER — Other Ambulatory Visit: Payer: Self-pay

## 2021-04-27 ENCOUNTER — Ambulatory Visit (INDEPENDENT_AMBULATORY_CARE_PROVIDER_SITE_OTHER): Payer: Self-pay | Admitting: Dermatology

## 2021-04-27 DIAGNOSIS — L700 Acne vulgaris: Secondary | ICD-10-CM

## 2021-04-27 MED ORDER — MINOCYCLINE HCL 50 MG PO CAPS: 50.0000 mg | ORAL_CAPSULE | Freq: Two times a day (BID) | ORAL | 1 refills | Status: AC

## 2021-04-27 NOTE — Progress Notes (Signed)
   Follow-Up Visit   Subjective  Ashley Dudley is a 55 y.o. female who presents for the following: Acne (Patient has been having some bad adult acne. Mostly forehead and cheek. Deep painful acne per patient. History of acne her whole life but has gotten worse this year. Treatment OTC differin Gel and cetaphil wash. ).  acne Location:  Duration:  Quality:  Associated Signs/Symptoms: Modifying Factors:  Severity:  Timing: Context:   Objective  Well appearing patient in no apparent distress; mood and affect are within normal limits. Head - Anterior (Face) Scattered facial cysts, not the classic central facial distribution of rosacea   A focused examination was performed including head, neck, arms.. Relevant physical exam findings are noted in the Assessment and Plan.   Assessment & Plan    Acne vulgaris Head - Anterior (Face)  Cyclin, 50 mg twice daily.  All side effects detailed.  All the treatment options discussed.  Recheck 2 months  minocycline (MINOCIN) 50 MG capsule - Head - Anterior (Face) Take 1 capsule (50 mg total) by mouth 2 (two) times daily.     I, Lavonna Monarch, MD, have reviewed all documentation for this visit.  The documentation on 05/03/21 for the exam, diagnosis, procedures, and orders are all accurate and complete.

## 2021-05-03 ENCOUNTER — Encounter: Payer: Self-pay | Admitting: Dermatology

## 2021-06-04 ENCOUNTER — Telehealth: Payer: Self-pay | Admitting: Dermatology

## 2021-06-04 MED ORDER — AMZEEQ 4 % EX FOAM: CUTANEOUS | 3 refills | Status: AC

## 2021-06-04 NOTE — Telephone Encounter (Signed)
The MCN worked + she said [place] would now put her on a topical.  Pharmacy: [facility], [facility] [facility]

## 2021-06-04 NOTE — Telephone Encounter (Signed)
Per Dr Saintclair Halsted

## 2021-06-08 ENCOUNTER — Ambulatory Visit: Payer: Self-pay | Admitting: Dermatology
